# Patient Record
Sex: Male | Born: 1990 | Race: White | Hispanic: No | Marital: Single | Smoking: Never smoker
Health system: Southern US, Community
[De-identification: ages and names within clinical notes are randomized; demographics above are authoritative.]

## PROBLEM LIST (undated history)

## (undated) DIAGNOSIS — I82409 Acute embolism and thrombosis of unspecified deep veins of unspecified lower extremity: Secondary | ICD-10-CM

## (undated) DIAGNOSIS — Z789 Other specified health status: Secondary | ICD-10-CM

## (undated) HISTORY — PX: NO PAST SURGERIES: SHX2092

## (undated) HISTORY — PX: MOUTH SURGERY: SHX715

---

## 2011-08-01 ENCOUNTER — Inpatient Hospital Stay (HOSPITAL_BASED_OUTPATIENT_CLINIC_OR_DEPARTMENT_OTHER)
Admission: EM | Admit: 2011-08-01 | Discharge: 2011-08-04 | DRG: 478 | Disposition: A | Discharge status: Home / Self Care | Payer: BC Managed Care – PPO | Attending: Internal Medicine | Admitting: Internal Medicine

## 2011-08-01 DIAGNOSIS — I82409 Acute embolism and thrombosis of unspecified deep veins of unspecified lower extremity: Secondary | ICD-10-CM

## 2011-08-01 DIAGNOSIS — I82402 Acute embolism and thrombosis of unspecified deep veins of left lower extremity: Secondary | ICD-10-CM

## 2011-08-01 DIAGNOSIS — I824Y9 Acute embolism and thrombosis of unspecified deep veins of unspecified proximal lower extremity: Principal | ICD-10-CM | POA: Diagnosis present

## 2011-08-01 DIAGNOSIS — E871 Hypo-osmolality and hyponatremia: Secondary | ICD-10-CM | POA: Diagnosis not present

## 2011-08-01 HISTORY — DX: Other specified health status: Z78.9

## 2011-08-01 LAB — DIFFERENTIAL
Basophils Relative: 0 % (ref 0–1)
Lymphs Abs: 1.3 10*3/uL (ref 0.7–4.0)
Monocytes Absolute: 0.6 10*3/uL (ref 0.1–1.0)
Monocytes Relative: 8 % (ref 3–12)
Neutro Abs: 5.9 10*3/uL (ref 1.7–7.7)

## 2011-08-01 LAB — COMPREHENSIVE METABOLIC PANEL
Albumin: 4.3 g/dL (ref 3.5–5.2)
Alkaline Phosphatase: 72 U/L (ref 39–117)
BUN: 9 mg/dL (ref 6–23)
CO2: 28 mEq/L (ref 19–32)
Chloride: 95 mEq/L — ABNORMAL LOW (ref 96–112)
Creatinine, Ser: 0.7 mg/dL (ref 0.50–1.35)
GFR calc Af Amer: >90 mL/min (ref >90)
GFR calc non Af Amer: >90 mL/min (ref >90)
Glucose, Bld: 103 mg/dL — ABNORMAL HIGH (ref 70–99)
Total Bilirubin: 0.5 mg/dL (ref 0.3–1.2)

## 2011-08-01 LAB — PROTIME-INR
INR: 1.08 (ref 0.00–1.49)
Prothrombin Time: 14.2 seconds (ref 11.6–15.2)

## 2011-08-01 LAB — CBC
HCT: 43.5 % (ref 39.0–52.0)
Hemoglobin: 14.8 g/dL (ref 13.0–17.0)
MCHC: 34 g/dL (ref 30.0–36.0)
RBC: 5.08 MIL/uL (ref 4.22–5.81)

## 2011-08-01 MED ORDER — ENOXAPARIN SODIUM 60 MG/0.6ML ~~LOC~~ SOLN
60.0000 mg | Freq: Once | SUBCUTANEOUS | Status: DC
  Filled 2011-08-01: qty 0.6

## 2011-08-01 MED ORDER — HYDROMORPHONE HCL PF 1 MG/ML IJ SOLN
1.0000 mg | INTRAMUSCULAR | Status: DC | PRN
  Administered 2011-08-02 – 2011-08-03 (×6): 1 mg via INTRAVENOUS
  Filled 2011-08-01 (×6): qty 1

## 2011-08-01 MED ORDER — SODIUM CHLORIDE 0.9 % IV SOLN: Freq: Once | INTRAVENOUS | Status: DC

## 2011-08-01 MED ORDER — MORPHINE SULFATE 4 MG/ML IJ SOLN
8.0000 mg | Freq: Once | INTRAMUSCULAR | Status: AC
  Administered 2011-08-01: 4 mg via INTRAVENOUS
  Filled 2011-08-01: qty 2

## 2011-08-01 MED ORDER — HEPARIN BOLUS VIA INFUSION
4000.0000 [IU] | Freq: Once | INTRAVENOUS | Status: AC
  Administered 2011-08-01: 4000 [IU] via INTRAVENOUS
  Filled 2011-08-01: qty 4000

## 2011-08-01 MED ORDER — HEPARIN SOD (PORCINE) IN D5W 100 UNIT/ML IV SOLN
1950.0000 [IU]/h | INTRAVENOUS | Status: DC
  Administered 2011-08-01 (×2): 1000 [IU]/h via INTRAVENOUS
  Administered 2011-08-02: 1200 [IU]/h via INTRAVENOUS
  Administered 2011-08-03: 1600 [IU]/h via INTRAVENOUS
  Administered 2011-08-03: 1950 [IU]/h via INTRAVENOUS
  Filled 2011-08-01 (×6): qty 250

## 2011-08-01 MED ORDER — SODIUM CHLORIDE 0.9 % IV SOLN
Freq: Once | INTRAVENOUS | Status: AC
  Administered 2011-08-01: 21:00:00 via INTRAVENOUS

## 2011-08-01 MED ORDER — ONDANSETRON HCL 4 MG/2ML IJ SOLN: 4.0000 mg | Freq: Once | INTRAMUSCULAR | Status: DC

## 2011-08-01 NOTE — ED Provider Notes (Signed)
History     CSN: [id number]  Arrival date & time 08/01/11  1814   First MD Initiated Contact with Patient 08/01/11 1912      Chief Complaint  Patient presents with  . Leg Pain  . Back Pain    (Consider location/radiation/quality/duration/timing/severity/associated sxs/prior treatment) Patient is a 20 y.o. male presenting with leg pain and back pain. The history is provided by the patient. No language interpreter was used.  Leg Pain  The incident occurred more than 1 week ago. There was no injury mechanism. The pain is present in the left knee, left ankle and left thigh. The quality of the pain is described as aching and sharp. The pain is at a severity of 7/10. The pain is moderate. Pertinent negatives include no numbness, no inability to bear weight and no loss of motion. He reports no foreign bodies present. He has tried acetaminophen for the symptoms. The treatment provided no relief.  Back Pain  Associated symptoms include leg pain. Pertinent negatives include no numbness.  Pt reports pain began a week ago in his low back.  Pt reports he was seen by his Md and started on flexeril and vicodin 2 days ago.  Pt reports today leg looks swollen and looks darker than other leg.  Pt complains of pain down his shin.  Pt flew home from college a week ago for winter break.  Flight was about 90 minutes.   History reviewed. No pertinent past medical history.  Past Surgical History  Procedure Date  . Mouth surgery     No family history on file.  History  Substance Use Topics  . Smoking status: Never Smoker   . Smokeless tobacco: Never Used  . Alcohol Use: Yes     occasional      Review of Systems  Musculoskeletal: Positive for back pain and gait problem.  Neurological: Negative for numbness.  All other systems reviewed and are negative.    Allergies  Review of patient's allergies indicates no known allergies.  Home Medications   Current Outpatient Rx  Name Route Sig  Dispense Refill  . CYCLOBENZAPRINE HCL 5 MG PO TABS Oral Take 5 mg by mouth 3 (three) times daily.      Marland Kitchen HYDROCODONE-ACETAMINOPHEN 5-500 MG PO TABS Oral Take 1 tablet by mouth every 4 (four) hours as needed. For pain       BP 106/66  Pulse 122  Temp(Src) 99.2 F (37.3 C) (Oral)  Resp 16  Ht 6' (1.829 m)  Wt 123 lb (55.792 kg)  BMI 16.68 kg/m2  SpO2 100%  Physical Exam  Nursing note and vitals reviewed. Constitutional: He is oriented to person, place, and time. He appears well-developed and well-nourished.  HENT:  Head: Normocephalic and atraumatic.  Mouth/Throat: Oropharynx is clear and moist.  Eyes: Conjunctivae are normal. Pupils are equal, round, and reactive to light.  Cardiovascular: Normal rate and normal heart sounds.   Pulmonary/Chest: Effort normal.  Abdominal: Soft.  Musculoskeletal: He exhibits edema and tenderness.  Neurological: He is alert and oriented to person, place, and time.  Skin: Skin is warm.  Psychiatric: He has a normal mood and affect.    ED Course  Procedures (including critical care time)   Labs Reviewed  CBC  DIFFERENTIAL  COMPREHENSIVE METABOLIC PANEL  PROTIME-INR   No results found.   No diagnosis found.  Results for orders placed during the hospital encounter of 08/01/11  CBC      Component Value Range  WBC 8.0  4.0 - 10.5 (K/uL)   RBC 5.08  4.22 - 5.81 (MIL/uL)   Hemoglobin 14.8  13.0 - 17.0 (g/dL)   HCT 16.1  09.6 - 04.5 (%)   MCV 85.6  78.0 - 100.0 (fL)   MCH 29.1  26.0 - 34.0 (pg)   MCHC 34.0  30.0 - 36.0 (g/dL)   RDW 40.9  81.1 - 91.4 (%)   Platelets 309  150 - 400 (K/uL)  DIFFERENTIAL      Component Value Range   Neutrophils Relative 74  43 - 77 (%)   Neutro Abs 5.9  1.7 - 7.7 (K/uL)   Lymphocytes Relative 17  12 - 46 (%)   Lymphs Abs 1.3  0.7 - 4.0 (K/uL)   Monocytes Relative 8  3 - 12 (%)   Monocytes Absolute 0.6  0.1 - 1.0 (K/uL)   Eosinophils Relative 2  0 - 5 (%)   Eosinophils Absolute 0.1  0.0 - 0.7 (K/uL)    Basophils Relative 0  0 - 1 (%)   Basophils Absolute 0.0  0.0 - 0.1 (K/uL)  COMPREHENSIVE METABOLIC PANEL      Component Value Range   Sodium 135  135 - 145 (mEq/L)   Potassium 3.7  3.5 - 5.1 (mEq/L)   Chloride 95 (*) 96 - 112 (mEq/L)   CO2 28  19 - 32 (mEq/L)   Glucose, Bld 103 (*) 70 - 99 (mg/dL)   BUN 9  6 - 23 (mg/dL)   Creatinine, Ser 7.82  0.50 - 1.35 (mg/dL)   Calcium 9.8  8.4 - 95.6 (mg/dL)   Total Protein 7.8  6.0 - 8.3 (g/dL)   Albumin 4.3  3.5 - 5.2 (g/dL)   AST 22  0 - 37 (U/L)   ALT 17  0 - 53 (U/L)   Alkaline Phosphatase 72  39 - 117 (U/L)   Total Bilirubin 0.5  0.3 - 1.2 (mg/dL)   GFR calc non Af Amer >90  >90 (mL/min)   GFR calc Af Amer >90  >90 (mL/min)  PROTIME-INR      Component Value Range   Prothrombin Time 14.2  11.6 - 15.2 (seconds)   INR 1.08  0.00 - 1.49    No results found.   MDM  Dr. Patria Mane in to see and examine pt.   Ultrasound by Dr. Patria Mane shows DVT.   Pt given Lovenox injection and labs obtained.  Dr. Patria Mane spoke to Dr. Edilia [facility] who advised consult interventional radiology,  Elevation and heparin,  Dr. Patria Mane spoke to Dr. Miles Costain who advised he will see pt in am.  Labs returned.  I spoke to Dr. Lovell Sheehan  [facility] who will admit.          Langston Masker, [place] 08/01/11 2146

## 2011-08-01 NOTE — ED Notes (Signed)
Calls placed to vascular and [facility] via [facility]

## 2011-08-01 NOTE — ED Notes (Signed)
Pt reports onset of back pain radiating to left hip and leg that started one week ago.  He was seen by PMD, had a left hip XR that was normal and blood work done.  He reports pain isn't better after taking Flexeril and Vicodin.

## 2011-08-02 ENCOUNTER — Encounter (HOSPITAL_COMMUNITY): Payer: Self-pay | Admitting: *Deleted

## 2011-08-02 DIAGNOSIS — M7989 Other specified soft tissue disorders: Secondary | ICD-10-CM

## 2011-08-02 DIAGNOSIS — I82402 Acute embolism and thrombosis of unspecified deep veins of left lower extremity: Secondary | ICD-10-CM

## 2011-08-02 LAB — BASIC METABOLIC PANEL
GFR calc Af Amer: >90 mL/min (ref >90)
GFR calc non Af Amer: >90 mL/min (ref >90)
Potassium: 4.1 mEq/L (ref 3.5–5.1)
Sodium: 131 mEq/L — ABNORMAL LOW (ref 135–145)

## 2011-08-02 LAB — MAGNESIUM: Magnesium: 1.8 mg/dL (ref 1.5–2.5)

## 2011-08-02 LAB — TSH: TSH: 2.946 u[IU]/mL (ref 0.350–4.500)

## 2011-08-02 LAB — ANTITHROMBIN III: AntiThromb III Func: 75 % (ref 75–120)

## 2011-08-02 LAB — PHOSPHORUS: Phosphorus: 3.2 mg/dL (ref 2.3–4.6)

## 2011-08-02 MED ORDER — SODIUM CHLORIDE 0.9 % IV SOLN
INTRAVENOUS | Status: DC
  Administered 2011-08-02 – 2011-08-03 (×3): via INTRAVENOUS
  Administered 2011-08-04: 100 mL/h via INTRAVENOUS

## 2011-08-02 MED ORDER — PROMETHAZINE HCL 25 MG/ML IJ SOLN: 12.5000 mg | Freq: Four times a day (QID) | INTRAMUSCULAR | Status: DC | PRN

## 2011-08-02 MED ORDER — SODIUM CHLORIDE 0.9 % IJ SOLN
3.0000 mL | Freq: Two times a day (BID) | INTRAMUSCULAR | Status: DC
  Administered 2011-08-02: 3 mL via INTRAVENOUS

## 2011-08-02 MED ORDER — HEPARIN BOLUS VIA INFUSION: 4000.0000 [IU] | Freq: Once | INTRAVENOUS | Status: DC

## 2011-08-02 MED ORDER — HEPARIN BOLUS VIA INFUSION
4000.0000 [IU] | Freq: Once | INTRAVENOUS | Status: AC
  Administered 2011-08-02: 4000 [IU] via INTRAVENOUS
  Filled 2011-08-02: qty 4000

## 2011-08-02 MED ORDER — SODIUM CHLORIDE 0.9 % IV SOLN
250.0000 mL | INTRAVENOUS | Status: DC | PRN
  Administered 2011-08-02: 250 mL via INTRAVENOUS

## 2011-08-02 MED ORDER — CYCLOBENZAPRINE HCL 5 MG PO TABS
5.0000 mg | ORAL_TABLET | Freq: Three times a day (TID) | ORAL | Status: DC
  Administered 2011-08-02 (×3): 5 mg via ORAL
  Filled 2011-08-02 (×6): qty 1

## 2011-08-02 MED ORDER — HEPARIN BOLUS VIA INFUSION
2000.0000 [IU] | Freq: Once | INTRAVENOUS | Status: AC
  Administered 2011-08-02: 2000 [IU] via INTRAVENOUS
  Filled 2011-08-02: qty 2000

## 2011-08-02 MED ORDER — SODIUM CHLORIDE 0.9 % IJ SOLN
3.0000 mL | INTRAMUSCULAR | Status: DC | PRN
  Administered 2011-08-03: 3 mL via INTRAVENOUS

## 2011-08-02 MED ORDER — PROMETHAZINE HCL 25 MG PO TABS: 12.5000 mg | ORAL_TABLET | Freq: Four times a day (QID) | ORAL | Status: DC | PRN

## 2011-08-02 MED ORDER — HYDROCODONE-ACETAMINOPHEN 5-325 MG PO TABS
2.0000 | ORAL_TABLET | ORAL | Status: DC | PRN
  Administered 2011-08-02 – 2011-08-04 (×3): 2 via ORAL
  Filled 2011-08-02 (×4): qty 2

## 2011-08-02 MED ORDER — OXYCODONE HCL 5 MG PO TABS: 5.0000 mg | ORAL_TABLET | ORAL | Status: DC | PRN

## 2011-08-02 NOTE — H&P (Signed)
PCP:  No primary provider on file.   DOA:  08/01/2011  6:35 PM  Chief Complaint:  Left leg swelling and tibial pain  HPI: Patient is a 20 y.o. male who presens with left LE pain and swelling that he first noticed approximately one week prior to admission and has been getting progressively worse. He describes pain as sharp, non radiating, with no specific aggravating or alleviating factors, 7/10 in severity, no similar episodes in the past. He denies any trauma to the area. Currently he has difficulty ambulating due to pain. He denies fevers, chills, no other systemic concerns, no abdominal or urinary concerns. He denies recent sicknesses or hospitalizations, no LE weakness or numbness, no tingling, no warmth to touch.  Allergies: No Known Allergies  Prior to Admission medications   Medication Sig Start Date End Date Taking? Authorizing Provider  cyclobenzaprine (FLEXERIL) 5 MG tablet Take 5 mg by mouth 3 (three) times daily.     Yes Historical Provider, MD  HYDROcodone-acetaminophen (VICODIN) 5-500 MG per tablet Take 1 tablet by mouth every 4 (four) hours as needed. For pain    Yes Historical Provider, MD    Past Medical History  Diagnosis Date  . No pertinent past medical history     Past Surgical History  Procedure Date  . Mouth surgery   . No past surgeries     Social History:  reports that he has never smoked. He has never used smokeless tobacco. He reports that he drinks alcohol. He reports that he does not use illicit drugs.  No family history on file.  Review of Systems:  Constitutional: Denies fever, chills, diaphoresis, appetite change and fatigue.  HEENT: Denies photophobia, eye pain, redness, hearing loss, ear pain, congestion, sore throat, rhinorrhea, sneezing, mouth sores, trouble swallowing, neck pain, neck stiffness and tinnitus.   Respiratory: Denies SOB, DOE, cough, chest tightness,  and wheezing.   Cardiovascular: Denies chest pain, palpitations and leg  swelling.  Gastrointestinal: Denies nausea, vomiting, abdominal pain, diarrhea, constipation, blood in stool and abdominal distention.  Genitourinary: Denies dysuria, urgency, frequency, hematuria, flank pain and difficulty urinating.  Musculoskeletal: Denies myalgias, joint swelling, arthralgias.  Skin: Denies pallor, rash and wound.  Neurological: Denies dizziness, seizures, syncope, weakness, light-headedness, numbness and headaches.  Hematological: Denies adenopathy. Easy bruising, personal or family bleeding history  Psychiatric/Behavioral: Denies suicidal ideation, mood changes, confusion, nervousness, sleep disturbance and agitation  Physical Exam:  Filed Vitals:   08/01/11 1831 08/01/11 1851 08/01/11 2236 08/02/11 0005  BP: 106/66  120/65 115/75  Pulse: 122  104 101  Temp: 99.2 F (37.3 C)  99.9 F (37.7 C) 99.5 F (37.5 C)  TempSrc: Oral  Oral Oral  Resp: 16   18  Height: 6' (1.829 m) 6' (1.829 m)    Weight: 55.792 kg (123 lb) 55.792 kg (123 lb)    SpO2: 100%  100%     Constitutional: Vital signs reviewed.  Patient is a well-developed and well-nourished in no acute distress and cooperative with exam. Alert and oriented x3.  Head: Normocephalic and atraumatic Ear: TM normal bilaterally Mouth: no erythema or exudates, MMM Eyes: PERRL, EOMI, conjunctivae normal, No scleral icterus.  Neck: Supple, Trachea midline normal ROM, No JVD, mass, thyromegaly, or carotid bruit present.  Cardiovascular: RRR, S1 normal, S2 normal, no MRG, pulses symmetric and intact bilaterally Pulmonary/Chest: CTAB, no wheezes, rales, or rhonchi Abdominal: Soft. Non-tender, non-distended, bowel sounds are normal, no masses, organomegaly, or guarding present.  GU: no CVA tenderness Musculoskeletal: No  joint deformities, erythema, or stiffness, ROM full and no nontender Ext: no edema in right LE and no cyanosis, Left LE slightly more swollen, pulses palpable bilaterally (DP and PT) Hematology: no  cervical, inginal, or axillary adenopathy.  Neurological: A&O x3, Strenght is normal and symmetric bilaterally, cranial nerve II-XII are grossly intact, no focal motor deficit, sensory intact to light touch bilaterally.  Skin: Warm, dry and intact. No rash, cyanosis, or clubbing.  Psychiatric: Normal mood and affect. speech and behavior is normal. Judgment and thought content normal. Cognition and memory are normal.   Labs on Admission:  Results for orders placed during the hospital encounter of 08/01/11 (from the past 48 hour(s))  CBC     Status: Normal   Collection Time   08/01/11  8:22 PM      Component Value Range Comment   WBC 8.0  4.0 - 10.5 (K/uL)    RBC 5.08  4.22 - 5.81 (MIL/uL)    Hemoglobin 14.8  13.0 - 17.0 (g/dL)    HCT 45.4  09.8 - 11.9 (%)    MCV 85.6  78.0 - 100.0 (fL)    MCH 29.1  26.0 - 34.0 (pg)    MCHC 34.0  30.0 - 36.0 (g/dL)    RDW 14.7  82.9 - 56.2 (%)    Platelets 309  150 - 400 (K/uL)   DIFFERENTIAL     Status: Normal   Collection Time   08/01/11  8:22 PM      Component Value Range Comment   Neutrophils Relative 74  43 - 77 (%)    Neutro Abs 5.9  1.7 - 7.7 (K/uL)    Lymphocytes Relative 17  12 - 46 (%)    Lymphs Abs 1.3  0.7 - 4.0 (K/uL)    Monocytes Relative 8  3 - 12 (%)    Monocytes Absolute 0.6  0.1 - 1.0 (K/uL)    Eosinophils Relative 2  0 - 5 (%)    Eosinophils Absolute 0.1  0.0 - 0.7 (K/uL)    Basophils Relative 0  0 - 1 (%)    Basophils Absolute 0.0  0.0 - 0.1 (K/uL)   COMPREHENSIVE METABOLIC PANEL     Status: Abnormal   Collection Time   08/01/11  8:22 PM      Component Value Range Comment   Sodium 135  135 - 145 (mEq/L)    Potassium 3.7  3.5 - 5.1 (mEq/L)    Chloride 95 (*) 96 - 112 (mEq/L)    CO2 28  19 - 32 (mEq/L)    Glucose, Bld 103 (*) 70 - 99 (mg/dL)    BUN 9  6 - 23 (mg/dL)    Creatinine, Ser 1.30  0.50 - 1.35 (mg/dL)    Calcium 9.8  8.4 - 10.5 (mg/dL)    Total Protein 7.8  6.0 - 8.3 (g/dL)    Albumin 4.3  3.5 - 5.2 (g/dL)     AST 22  0 - 37 (U/L)    ALT 17  0 - 53 (U/L)    Alkaline Phosphatase 72  39 - 117 (U/L)    Total Bilirubin 0.5  0.3 - 1.2 (mg/dL)    GFR calc non Af Amer >90  >90 (mL/min)    GFR calc Af Amer >90  >90 (mL/min)   PROTIME-INR     Status: Normal   Collection Time   08/01/11  8:32 PM      Component Value Range Comment   Prothrombin Time  14.2  11.6 - 15.2 (seconds)    INR 1.08  0.00 - 1.49      Radiological Exams on Admission: No information on file.  Assessment/Plan  Principal Problem:  *Left leg DVT - obtain LE doppler for confirmation - continue treatment with Heparin and plan transition to Coumadin - pt is currently hemodynamically stable - monitor vitals per floor protocol   Disposition - plan of care and diagnosis, diagnostic studies and test results were discussed with pt and family at bedside - pt and  family verbalized understanding  Time Spent on Admission:  Over 30 minutes  MAGICK-Karas Pickerill 08/02/2011, 1:18 AM  [facility] Pager [phone]

## 2011-08-02 NOTE — Progress Notes (Signed)
ANTICOAGULATION CONSULT NOTE - Follow Up Consult  Pharmacy Consult for Heparin Indication: DVT  No Known Allergies  Patient Measurements: Height: 6' (182.9 cm) Weight: 123 lb (55.792 kg) IBW/kg (Calculated) : 77.6   Vital Signs: Temp: 99.6 F (37.6 C) (12/22 1343) Temp src: Oral (12/22 1343) BP: 116/75 mmHg (12/22 1343) Pulse Rate: 87  (12/22 1343)  Labs:  Basename 08/02/11 1502 08/02/11 0916 08/02/11 0145 08/01/11 2032 08/01/11 2022  HGB -- -- -- -- 14.8  HCT -- -- -- -- 43.5  PLT -- -- -- -- 309  APTT -- -- -- -- --  LABPROT -- -- -- 14.2 --  INR -- -- -- 1.08 --  HEPARINUNFRC 0.22* 0.47 0.25* -- --  CREATININE -- -- 0.66 -- 0.70  CKTOTAL -- -- -- -- --  CKMB -- -- -- -- --  TROPONINI -- -- -- -- --   Estimated Creatinine Clearance: 116.3 ml/min (by C-G formula based on Cr of 0.66).   Medications:  Scheduled:    . sodium chloride   Intravenous Once  . cyclobenzaprine  5 mg Oral TID  . heparin  4,000 Units Intravenous Once  . heparin  4,000 Units Intravenous Once  .  morphine injection  8 mg Intravenous Once  . sodium chloride  3 mL Intravenous Q12H  . DISCONTD: sodium chloride   Intravenous Once  . DISCONTD: enoxaparin  60 mg Subcutaneous Once  . DISCONTD: heparin  4,000 Units Intravenous Once  . DISCONTD: ondansetron  4 mg Intravenous Once    Assessment: 20 yo M with new DVT started on IV heparin.  Repeat level subtherapeutic. No bleeding noted. Infusing without complications.  Noted plan for lysis on 12/23.    Goal of Therapy:  Heparin level 0.3-0.7 units/ml   Plan:  1) Rebolus heparin 2000 units x 1 2) Increase heparin 1300 units/hr 3) Check 6 hr heparin level 4) F/U anticoag/lytic orders on 12/23  Elson Clan 08/02/2011,4:47 PM

## 2011-08-02 NOTE — Progress Notes (Signed)
Pt arrived on floor. Dr paged. Patient's Left foot was dopplered and a weak pulse was heard.

## 2011-08-02 NOTE — Progress Notes (Signed)
Family concerned that no Dr. Has been in the room to explain procedure or possible option. Pt has been premedicated well with Dilaudid for pain and Flexiril for muscle spasms. Seem to help relax pt. Dr. Rica Records paged while in IR. Author spoke with Shanon Ace since MD was doing a procedure. Paged again after 2hrs as the [place] was not read yet. Author spoke with Dr. Lenetta Quaker who gave me Dr. Serena Colonel direct pager. Dr. Rica Records called back with a plan to try to lyse the clot in the morning and continue with hep therapy and keep pt NPO after midnight. Family still not content, paged MD again and he will be here tomorrow at 0700 with explanations and answer any family questions. Will continue to monitor.

## 2011-08-02 NOTE — Progress Notes (Signed)
Patient and his mother were given deep vein thrombosis education material from [facility] consult. Will continue to monitor.

## 2011-08-02 NOTE — Progress Notes (Signed)
ANTICOAGULATION CONSULT NOTE - Initial Consult  Pharmacy Consult for heparin Indication: DVT  No Known Allergies  Patient Measurements: Height: 6' (182.9 cm) Weight: 123 lb (55.792 kg) IBW/kg (Calculated) : 77.6   Vital Signs: Temp: 98.3 F (36.8 C) (12/22 0601) Temp src: Oral (12/22 0601) BP: 117/68 mmHg (12/22 0601) Pulse Rate: 100  (12/22 0601)  Labs:  Basename 08/02/11 0916 08/02/11 0145 08/01/11 2032 08/01/11 2022  HGB -- -- -- 14.8  HCT -- -- -- 43.5  PLT -- -- -- 309  APTT -- -- -- --  LABPROT -- -- 14.2 --  INR -- -- 1.08 --  HEPARINUNFRC 0.47 0.25* -- --  CREATININE -- 0.66 -- 0.70  CKTOTAL -- -- -- --  CKMB -- -- -- --  TROPONINI -- -- -- --   Estimated Creatinine Clearance: 116.3 ml/min (by C-G formula based on Cr of 0.66).  Medical History: Past Medical History  Diagnosis Date  . No pertinent past medical history     Medications:  Prescriptions prior to admission  Medication Sig Dispense Refill  . cyclobenzaprine (FLEXERIL) 5 MG tablet Take 5 mg by mouth 3 (three) times daily.        Marland Kitchen HYDROcodone-acetaminophen (VICODIN) 5-500 MG per tablet Take 1 tablet by mouth every 4 (four) hours as needed. For pain        Scheduled:     . sodium chloride   Intravenous Once  . cyclobenzaprine  5 mg Oral TID  . heparin  4,000 Units Intravenous Once  . heparin  4,000 Units Intravenous Once  .  morphine injection  8 mg Intravenous Once  . sodium chloride  3 mL Intravenous Q12H  . DISCONTD: sodium chloride   Intravenous Once  . DISCONTD: enoxaparin  60 mg Subcutaneous Once  . DISCONTD: heparin  4,000 Units Intravenous Once  . DISCONTD: ondansetron  4 mg Intravenous Once    Assessment: 20yo male subtherapeutic on heparin started at [facility] for +DVT. Noted LMWH ordered at [facility], but not charted as given, but confirmed that patient received 2 x heparin 4000 unit bolus (at [facility] last  PM and then again this AM). Heparin level therapeutic for goal. CBC ok.  Goal of  Therapy:  Heparin level 0.3-0.7 units/ml   Plan: 1. Continue heparin drip at 1200 units/hr. 2. Recheck heparin level at 1500 to ensure therapeutic per protocol.  Sherrice Creekmore K. Allena Katz, PharmD, BCPS.  Clinical Pharmacist Pager [phone]. 08/02/2011 10:34 AM

## 2011-08-02 NOTE — ED Provider Notes (Addendum)
Medical screening examination/treatment/procedure(s) were conducted as a shared visit with non-physician practitioner(s) and myself.  I personally evaluated the patient during the encounter  Personally evaluated patient's left leg.  He has dopplerable femoral popliteal and PT pulses on his left.  His left leg does appear swollen and is slightly dusky on examination.  There is no erythema.  His main complaint is left tibial pain.  On that side ultrasound in the emergency department I personally evaluated the patient's left femoral vein and there appears to be occlusive clot as it is noncompressible from the inguinal ligament down to the level of his distal thigh.  Given his symptoms which are concerning for DVT in my bedside ultrasound findings which demonstrate a noncompressible left femoral vein with what appears to be clotted at the patient will be started on anticoagulation.  Had concerns for phlegmasia and thus I discussed the case with the vascular surgeon who recommends elevation and heparinization at this time.  He also recommends consultation with interventional radiology for possible lysis and further evaluation.  I discussed this with interventional radiology who will see the patient first thing in the morning.  The patient's been heparinized in the emergency apartment will be transferred to [facility] in stable condition.  He will require formal ultrasonography/venous duplex imaging of his left lower extremity for confirmation.  His compartments are soft he has no chest pain or shortness of breath to suggest pulmonary embolism.  1. Left leg DVT     Lyanne Co, MD 08/02/11 2956  Lyanne Co, MD 08/02/11 [phone]

## 2011-08-02 NOTE — Progress Notes (Signed)
*  PRELIMINARY RESULTS*   Left lower extremity venous Doppler completed.  There is acute, occlusive deep vein thrombosis noted throughout the left lower extremity, beginning in the posterior tibial and coursing through to the popliteal, femoral and common femoral veins.  No propagation to the right lower extremity noted.    Sherren Kerns Renee 08/02/2011, 11:44 AM

## 2011-08-02 NOTE — Progress Notes (Addendum)
  Subjective: Denies leg pain at this time.  Objective: Vital signs in last 24 hours: Temp:  [98.3 F (36.8 C)-99.9 F (37.7 C)] 98.3 F (36.8 C) (12/22 0601) Pulse Rate:  [100-122] 100  (12/22 0601) Resp:  [16-18] 18  (12/22 0601) BP: (106-120)/(65-75) 117/68 mmHg (12/22 0601) SpO2:  [100 %] 100 % (12/22 0601) Weight:  [123 lb (55.792 kg)] 123 lb (55.792 kg) (12/21 1851) Last BM Date: 08/01/11  Intake/Output from previous day:   Intake/Output this shift:    Exam: Left leg edematous compared with right leg on visual inspection. Pt denies pain with palpation of calf. Negative Homan's sign.  Lab Results:   [facility] 08/01/11 2022  WBC 8.0  HGB 14.8  HCT 43.5  PLT 309   BMET  Basename 08/02/11 0145 08/01/11 2022  NA 131* 135  K 4.1 3.7  CL 97 95*  CO2 25 28  GLUCOSE 117* 103*  BUN 8 9  CREATININE 0.66 0.70  CALCIUM 9.3 9.8   PT/INR  Basename 08/01/11 2032  LABPROT 14.2  INR 1.08   ABG No results found for this basename: PHART:2,PCO2:2,PO2:2,HCO3:2 in the last 72 hours  Studies/Results: No results found.  Anti-infectives: Anti-infectives    None      Assessment/Plan Left lower extremity DVT. Recent air travel. Awaiting lower extremity dopplers for further evaluation of the extent of the problem. Pt currently on IV heparin. Discussed lysis procedure with pt and his mother. No obvious contraindications. Discussed with Dr Deanne Coffer. Await official doppler report before making any decisions as to Lytic therapy.  LOS: 1 day    RINEHULS,DAVID 08/02/2011   US shows extensive LLE DVT through common femoral vein. Mechanical/pharmacologic lysis indicated to preserve venous valvular competence, avoid chronic venous insufficiency, exclude May-Thurner syndrome. Will make pt NPO p MN, plan lysis in AM.

## 2011-08-02 NOTE — Progress Notes (Signed)
Subjective: Pain better. Denies chest pain or dyspnea.  Objective: Filed Vitals:   08/01/11 2236 08/02/11 0005 08/02/11 0601 08/02/11 1343  BP: 120/65 115/75 117/68 116/75  Pulse: 104 101 100 87  Temp: 99.9 F (37.7 C) 99.5 F (37.5 C) 98.3 F (36.8 C) 99.6 F (37.6 C)  TempSrc: Oral Oral Oral Oral  Resp:  18 18 16   Height:      Weight:      SpO2: 100%  100% 96%    General: Alert, awake, oriented x3, in no acute distress.  HEENT: No bruits, no goiter.  Heart: Regular rate and rhythm, without murmurs, rubs, gallops.  Lungs: CTA, bilateral air movement.  Abdomen: Soft, nontender, nondistended, positive bowel sounds.  Neuro: Grossly intact, nonfocal. Extremities: Left lower extremities with mild redness, swelling.   Lab Results:  [facility] 08/02/11 0145 08/01/11 2022  NA 131* 135  K 4.1 3.7  CL 97 95*  CO2 25 28  GLUCOSE 117* 103*  BUN 8 9  CREATININE 0.66 0.70  CALCIUM 9.3 9.8  MG 1.8 --  PHOS 3.2 --    Basename 08/01/11 2022  AST 22  ALT 17  ALKPHOS 72  BILITOT 0.5  PROT 7.8  ALBUMIN 4.3   Basename 08/01/11 2022  WBC 8.0  NEUTROABS 5.9  HGB 14.8  HCT 43.5  MCV 85.6  PLT 309    Basename 08/02/11 0145  TSH 2.946  T4TOTAL --  T3FREE --  THYROIDAB --     Medications: I have reviewed the patient's current medications.   Patient Active Hospital Problem List: Left leg DVT (08/02/2011) He denies chest pain or dyspnea.  unprovoked DVT, I will check Hypercoagulable panel although patient on heparin. Some of the test will need to be repeated outpatient. Patient will need referral to hematology outpatient. Continue with heparin. Will defer start coumadin to IR. For possible lysis tomorrow.  CBC in am.  Mild Hyponatremia: IV fluids.   LOS: 1 day   Daley Mooradian M.D.  [facility] 08/02/2011, 5:52 PM

## 2011-08-02 NOTE — Progress Notes (Signed)
ANTICOAGULATION CONSULT NOTE - Initial Consult  Pharmacy Consult for heparin Indication: DVT  No Known Allergies  Patient Measurements: Height: 6' (182.9 cm) Weight: 123 lb (55.792 kg) IBW/kg (Calculated) : 77.6   Vital Signs: Temp: 99.5 F (37.5 C) (12/22 0005) Temp src: Oral (12/22 0005) BP: 115/75 mmHg (12/22 0005) Pulse Rate: 101  (12/22 0005)  Labs:  Basename 08/02/11 0145 08/01/11 2032 08/01/11 2022  HGB -- -- 14.8  HCT -- -- 43.5  PLT -- -- 309  APTT -- -- --  LABPROT -- 14.2 --  INR -- 1.08 --  HEPARINUNFRC 0.25* -- --  CREATININE 0.66 -- 0.70  CKTOTAL -- -- --  CKMB -- -- --  TROPONINI -- -- --   Estimated Creatinine Clearance: 116.3 ml/min (by C-G formula based on Cr of 0.66).  Medical History: Past Medical History  Diagnosis Date  . No pertinent past medical history     Medications:  Prescriptions prior to admission  Medication Sig Dispense Refill  . cyclobenzaprine (FLEXERIL) 5 MG tablet Take 5 mg by mouth 3 (three) times daily.        Marland Kitchen HYDROcodone-acetaminophen (VICODIN) 5-500 MG per tablet Take 1 tablet by mouth every 4 (four) hours as needed. For pain        Scheduled:    . sodium chloride   Intravenous Once  . cyclobenzaprine  5 mg Oral TID  . heparin  4,000 Units Intravenous Once  . heparin  4,000 Units Intravenous Once  .  morphine injection  8 mg Intravenous Once  . sodium chloride  3 mL Intravenous Q12H  . DISCONTD: sodium chloride   Intravenous Once  . DISCONTD: enoxaparin  60 mg Subcutaneous Once  . DISCONTD: heparin  4,000 Units Intravenous Once  . DISCONTD: ondansetron  4 mg Intravenous Once    Assessment: 20yo male subtherapeutic on heparin started at [facility] for +DVT; pt rec'd 4000 unit bolus after being given Lovenox 1mg /kg, then got repeat 4000 unit bolus after arrival (unclear whether before or after heparin level drawn); would expect level to be higher with these duplicate heparin products.  Goal of Therapy:  Heparin level  0.3-0.7 units/ml   Plan:  Will increase heparin to 1200 units/hr and check level in 6hr.  Colleen Can PharmD BCPS 08/02/2011,3:31 AM

## 2011-08-03 ENCOUNTER — Inpatient Hospital Stay (HOSPITAL_COMMUNITY): Payer: BC Managed Care – PPO

## 2011-08-03 DIAGNOSIS — I80299 Phlebitis and thrombophlebitis of other deep vessels of unspecified lower extremity: Secondary | ICD-10-CM

## 2011-08-03 LAB — CBC
Platelets: 301 10*3/uL (ref 150–400)
RDW: 12.3 % (ref 11.5–15.5)
WBC: 7.1 10*3/uL (ref 4.0–10.5)

## 2011-08-03 LAB — BASIC METABOLIC PANEL
Chloride: 98 mEq/L (ref 96–112)
Creatinine, Ser: 0.68 mg/dL (ref 0.50–1.35)
GFR calc Af Amer: >90 mL/min (ref >90)
Potassium: 4.2 mEq/L (ref 3.5–5.1)

## 2011-08-03 LAB — RAPID URINE DRUG SCREEN, HOSP PERFORMED
Benzodiazepines: NOT DETECTED
Cocaine: NOT DETECTED

## 2011-08-03 LAB — HEPARIN LEVEL (UNFRACTIONATED): Heparin Unfractionated: 0.27 IU/mL — ABNORMAL LOW (ref 0.30–0.70)

## 2011-08-03 MED ORDER — FENTANYL CITRATE 0.05 MG/ML IJ SOLN
INTRAMUSCULAR | Status: AC | PRN
  Administered 2011-08-03: 100 ug via INTRAVENOUS

## 2011-08-03 MED ORDER — SODIUM CHLORIDE 0.9 % IV SOLN
5.0000 mg | Freq: Once | INTRAVENOUS | Status: DC
  Filled 2011-08-03: qty 5

## 2011-08-03 MED ORDER — FENTANYL CITRATE 0.05 MG/ML IJ SOLN
INTRAMUSCULAR | Status: AC
  Filled 2011-08-03: qty 4

## 2011-08-03 MED ORDER — HEPARIN BOLUS VIA INFUSION
2000.0000 [IU] | Freq: Once | INTRAVENOUS | Status: AC
  Administered 2011-08-03: 2000 [IU] via INTRAVENOUS
  Filled 2011-08-03: qty 2000

## 2011-08-03 MED ORDER — ALTEPLASE 100 MG IV SOLR
5.0000 mg | Freq: Once | INTRAVENOUS | Status: AC
  Administered 2011-08-03: 5 mg
  Filled 2011-08-03: qty 5

## 2011-08-03 MED ORDER — MIDAZOLAM HCL 5 MG/5ML IJ SOLN
INTRAMUSCULAR | Status: AC | PRN
  Administered 2011-08-03 (×2): 2 mg via INTRAVENOUS

## 2011-08-03 MED ORDER — FENTANYL CITRATE 0.05 MG/ML IJ SOLN
INTRAMUSCULAR | Status: DC | PRN
  Administered 2011-08-03 (×4): 50 ug via INTRAVENOUS

## 2011-08-03 MED ORDER — MIDAZOLAM HCL 2 MG/2ML IJ SOLN
INTRAMUSCULAR | Status: AC
  Filled 2011-08-03: qty 4

## 2011-08-03 MED ORDER — MIDAZOLAM HCL 5 MG/5ML IJ SOLN
INTRAMUSCULAR | Status: DC | PRN
  Administered 2011-08-03 (×4): 1 mg via INTRAVENOUS

## 2011-08-03 MED ORDER — IOHEXOL 300 MG/ML  SOLN
150.0000 mL | Freq: Once | INTRAMUSCULAR | Status: AC | PRN
  Administered 2011-08-03: 90 mL via INTRAVENOUS

## 2011-08-03 MED ORDER — CYCLOBENZAPRINE HCL 10 MG PO TABS: 5.0000 mg | ORAL_TABLET | Freq: Three times a day (TID) | ORAL | Status: DC | PRN

## 2011-08-03 NOTE — Procedures (Signed)
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 Good response to mechanical and pharmacologic lysis. 14x60 Zilver stent across L CIV stenosis. No complication No blood loss. See complete dictation in [facility].

## 2011-08-03 NOTE — ED Notes (Signed)
Pt arrived from in patient unit with grayish blue nailbeds( RA sats 100%)

## 2011-08-03 NOTE — Progress Notes (Signed)
Subjective: He denies chest pain or dyspnea. No worsening lower extremity pain. He relates change in color of left lower extremity on ambulation.  Objective: Filed Vitals:   08/02/11 0601 08/02/11 1343 08/02/11 2051 08/03/11 0425  BP: 117/68 116/75 116/70 118/72  Pulse: 100 87 100 99  Temp: 98.3 F (36.8 C) 99.6 F (37.6 C) 100 F (37.8 C) 98.4 F (36.9 C)  TempSrc: Oral Oral Oral Oral  Resp: 18 16 18 18   Height:      Weight:      SpO2: 100% 96% 95% 100%    General: Alert, awake, oriented x3, in no acute distress.  HEENT: No bruits, no goiter.  Heart: Regular rate and rhythm, without murmurs, rubs, gallops.  Lungs: CTA, bilateral air movement.  Abdomen: Soft, nontender, nondistended, positive bowel sounds.  Neuro: Grossly intact, nonfocal. Extremities: left lower extremities with swelling, no cyanosis.   Lab Results:  [facility] 08/02/11 0145 08/01/11 2022  NA 131* 135  K 4.1 3.7  CL 97 95*  CO2 25 28  GLUCOSE 117* 103*  BUN 8 9  CREATININE 0.66 0.70  CALCIUM 9.3 9.8  MG 1.8 --  PHOS 3.2 --    Basename 08/01/11 2022  AST 22  ALT 17  ALKPHOS 72  BILITOT 0.5  PROT 7.8  ALBUMIN 4.3    Basename 08/03/11 0615 08/01/11 2022  WBC 7.1 8.0  NEUTROABS -- 5.9  HGB 13.0 14.8  HCT 38.6* 43.5  MCV 87.1 85.6  PLT 301 309    Basename 08/02/11 0145  TSH 2.946  T4TOTAL --  T3FREE --  THYROIDAB --   Micro Results: No results found for this or any previous visit (from the past 240 hour(s)).  Studies/Results: No results found.  Medications: I have reviewed the patient's current medications.  Patient Active Hospital Problem List:  Left leg DVT (08/02/2011)  Unprovoked DVT,  Hypercoagulable panel pending. Some of the test will need to be repeated outpatient. Patient will need referral to hematology outpatient.  Continue with heparin. Will defer start coumadin to IR.  For possible thrombolysis today. Appreciated Dr Edilia Bo help.  CBC in am.  Change flexeril to  PRN. Leg elevation. Mild Hyponatremia: IV fluids.       LOS: 2 days   Breckan Cafiero M.D.  [facility] 08/03/2011, 8:00 AM

## 2011-08-03 NOTE — Progress Notes (Signed)
ANTICOAGULATION CONSULT NOTE - Follow Up Consult  Pharmacy Consult for Heparin Indication: DVT  No Known Allergies  Patient Measurements: Height: 6' (182.9 cm) Weight: 123 lb (55.792 kg) IBW/kg (Calculated) : 77.6  Heparin weight: 56Kg  Vital Signs: Temp: 98.4 F (36.9 C) (12/23 0425) Temp src: Oral (12/23 0425) BP: 118/72 mmHg (12/23 0425) Pulse Rate: 99  (12/23 0425)  Labs:  Basename 08/03/11 0615 08/02/11 2310 08/02/11 1502 08/02/11 0145 08/01/11 2032 08/01/11 2022  HGB 13.0 -- -- -- -- 14.8  HCT 38.6* -- -- -- -- 43.5  PLT 301 -- -- -- -- 309  APTT -- -- -- -- -- --  LABPROT -- -- -- -- 14.2 --  INR -- -- -- -- 1.08 --  HEPARINUNFRC 0.27* <0.10* 0.22* -- -- --  CREATININE 0.68 -- -- 0.66 -- 0.70  CKTOTAL -- -- -- -- -- --  CKMB -- -- -- -- -- --  TROPONINI -- -- -- -- -- --   Estimated Creatinine Clearance: 116.3 ml/min (by C-G formula based on Cr of 0.68).   Medications:  Scheduled:     . heparin  2,000 Units Intravenous Once  . heparin  2,000 Units Intravenous Once  . sodium chloride  3 mL Intravenous Q12H  . DISCONTD: cyclobenzaprine  5 mg Oral TID    Assessment: 20yo with LLE DVT. Noted plans for possible thrombolysis today. Plans for hypercoagulable work up in AM. Coumadin start being held off until Sx plans made. Heparin level below goal despite aggressive increase in heparin drip rate. H/H decr slightly, Plts ok.  Goal of Therapy:  Heparin level 0.3-0.7 units/ml   Plan: 1. Increase heparin drip to 1800 units/hr 2. Recheck heparin level at 1500  Ulric Salzman K. Allena Katz, PharmD, BCPS.  Clinical Pharmacist Pager [phone]. 08/03/2011 8:22 AM

## 2011-08-03 NOTE — Progress Notes (Signed)
Transferred to IR at 12:25 PM  for Lysis of left leg clot. Will transfer to ICU after.

## 2011-08-03 NOTE — Progress Notes (Signed)
Pharmacy: Re-Heparin  Assessment: Patient's now s/p IR  and lysis with Alteplase for DVT. Heparin level is still subtherapeutic at 0.12 despite rate changed to 1800 units/hr this morning.  Spoke to Dr. Deanne Coffer, OK to keep heparin goal range of 0.3-0.7.  Plan: 1) Increase heparin drip to 1950 units/hr 2) Recheck 6 hour heparin level  Dorna Leitz, PharmD, BCPS Clinical Pharmacist

## 2011-08-03 NOTE — H&P (Signed)
Willie Charles is an 20 y.o. male.   Chief Complaint:  LLE DVT documented by venous doppler exam HPI: 20 yo male adm' d to [facility] after being seen in ER at [facility] with unexplained LLE DVT. Lysis has been discussed amd planned for today. Described procedure to parents at length again today including risks and benefits. They would like to speak to Dr Deanne Coffer prior to signing consent.  Past Medical History  Diagnosis Date  . No pertinent past medical history     Past Surgical History  Procedure Date  . Mouth surgery   . No past surgeries     No family history on file. Social History:  reports that he has never smoked. He has never used smokeless tobacco. He reports that he drinks alcohol. He reports that he does not use illicit drugs.  Allergies: No Known Allergies  Medications Prior to Admission  Medication Dose Route Frequency Provider Last Rate Last Dose  . 0.9 %  sodium chloride infusion   Intravenous Once Langston Masker, [place] 1,000 mL/hr at 08/01/11 2034    . 0.9 %  sodium chloride infusion  250 mL Intravenous PRN Iskra Magick-Myers 10 mL/hr at 08/02/11 0251 250 mL at 08/02/11 0251  . 0.9 %  sodium chloride infusion   Intravenous Continuous Belkys Regalado, MD 100 mL/hr at 08/02/11 2043    . cyclobenzaprine (FLEXERIL) tablet 5 mg  5 mg Oral TID PRN Belkys Regalado, MD      . heparin ADULT infusion 100 units/ml (25000 units/250 ml)  1,800 Units/hr Intravenous Continuous Christella Hartigan, PHARMD 18 mL/hr at 08/03/11 0833 1,800 Units/hr at 08/03/11 8295  . heparin bolus via infusion 2,000 Units  2,000 Units Intravenous Once [facility], PHARMD   2,000 Units at 08/02/11 1655  . heparin bolus via infusion 2,000 Units  2,000 Units Intravenous Once Colleen Can, PHARMD   2,000 Units at 08/03/11 0115  . heparin bolus via infusion 4,000 Units  4,000 Units Intravenous Once Lyanne Co, MD   4,000 Units at 08/01/11 2100  . heparin bolus via infusion 4,000 Units  4,000  Units Intravenous Once Iskra Magick-Myers   4,000 Units at 08/02/11 0251  . HYDROcodone-acetaminophen (NORCO) 5-325 MG per tablet 2 tablet  2 tablet Oral Q4H PRN Iskra Magick-Myers   2 tablet at 08/02/11 2018  . HYDROmorphone (DILAUDID) injection 1 mg  1 mg Intravenous Q4H PRN Langston Masker, [place]   1 mg at 08/03/11 0801  . morphine 4 MG/ML injection 8 mg  8 mg Intravenous Once Lyanne Co, MD   4 mg at 08/01/11 2042  . promethazine (PHENERGAN) tablet 12.5 mg  12.5 mg Oral Q6H PRN Iskra Magick-Myers       Or  . promethazine (PHENERGAN) injection 12.5 mg  12.5 mg Intravenous Q6H PRN Iskra Magick-Myers      . sodium chloride 0.9 % injection 3 mL  3 mL Intravenous Q12H Iskra Magick-Myers   3 mL at 08/02/11 1011  . sodium chloride 0.9 % injection 3 mL  3 mL Intravenous PRN Iskra Magick-Myers   3 mL at 08/03/11 0801  . DISCONTD: 0.9 %  sodium chloride infusion   Intravenous Once Langston Masker, [place]      . DISCONTD: cyclobenzaprine (FLEXERIL) tablet 5 mg  5 mg Oral TID Iskra Magick-Myers   5 mg at 08/02/11 2129  . DISCONTD: enoxaparin (LOVENOX) injection 60 mg  60 mg Subcutaneous Once Langston Masker, [place]      .  DISCONTD: heparin bolus via infusion 4,000 Units  4,000 Units Intravenous Once Iskra Magick-Myers      . DISCONTD: ondansetron (ZOFRAN) injection 4 mg  4 mg Intravenous Once Langston Masker, [place]      . DISCONTD: oxyCODONE (Oxy IR/ROXICODONE) immediate release tablet 5 mg  5 mg Oral Q4H PRN Iskra Magick-Myers       No current outpatient prescriptions on file as of 08/03/2011.    Results for orders placed during the hospital encounter of 08/01/11 (from the past 48 hour(s))  CBC     Status: Normal   Collection Time   08/01/11  8:22 PM      Component Value Range Comment   WBC 8.0  4.0 - 10.5 (K/uL)    RBC 5.08  4.22 - 5.81 (MIL/uL)    Hemoglobin 14.8  13.0 - 17.0 (g/dL)    HCT 04.5  40.9 - 81.1 (%)    MCV 85.6  78.0 - 100.0 (fL)    MCH 29.1  26.0 - 34.0 (pg)    MCHC 34.0  30.0 - 36.0 (g/dL)    RDW 91.4   78.2 - 95.6 (%)    Platelets 309  150 - 400 (K/uL)   DIFFERENTIAL     Status: Normal   Collection Time   08/01/11  8:22 PM      Component Value Range Comment   Neutrophils Relative 74  43 - 77 (%)    Neutro Abs 5.9  1.7 - 7.7 (K/uL)    Lymphocytes Relative 17  12 - 46 (%)    Lymphs Abs 1.3  0.7 - 4.0 (K/uL)    Monocytes Relative 8  3 - 12 (%)    Monocytes Absolute 0.6  0.1 - 1.0 (K/uL)    Eosinophils Relative 2  0 - 5 (%)    Eosinophils Absolute 0.1  0.0 - 0.7 (K/uL)    Basophils Relative 0  0 - 1 (%)    Basophils Absolute 0.0  0.0 - 0.1 (K/uL)   COMPREHENSIVE METABOLIC PANEL     Status: Abnormal   Collection Time   08/01/11  8:22 PM      Component Value Range Comment   Sodium 135  135 - 145 (mEq/L)    Potassium 3.7  3.5 - 5.1 (mEq/L)    Chloride 95 (*) 96 - 112 (mEq/L)    CO2 28  19 - 32 (mEq/L)    Glucose, Bld 103 (*) 70 - 99 (mg/dL)    BUN 9  6 - 23 (mg/dL)    Creatinine, Ser 2.13  0.50 - 1.35 (mg/dL)    Calcium 9.8  8.4 - 10.5 (mg/dL)    Total Protein 7.8  6.0 - 8.3 (g/dL)    Albumin 4.3  3.5 - 5.2 (g/dL)    AST 22  0 - 37 (U/L)    ALT 17  0 - 53 (U/L)    Alkaline Phosphatase 72  39 - 117 (U/L)    Total Bilirubin 0.5  0.3 - 1.2 (mg/dL)    GFR calc non Af Amer >90  >90 (mL/min)    GFR calc Af Amer >90  >90 (mL/min)   PROTIME-INR     Status: Normal   Collection Time   08/01/11  8:32 PM      Component Value Range Comment   Prothrombin Time 14.2  11.6 - 15.2 (seconds)    INR 1.08  0.00 - 1.49    HEPARIN LEVEL (UNFRACTIONATED)     Status: Abnormal  Collection Time   08/02/11  1:45 AM      Component Value Range Comment   Heparin Unfractionated 0.25 (*) 0.30 - 0.70 (IU/mL)   MAGNESIUM     Status: Normal   Collection Time   08/02/11  1:45 AM      Component Value Range Comment   Magnesium 1.8  1.5 - 2.5 (mg/dL)   PHOSPHORUS     Status: Normal   Collection Time   08/02/11  1:45 AM      Component Value Range Comment   Phosphorus 3.2  2.3 - 4.6 (mg/dL)   TSH      Status: Normal   Collection Time   08/02/11  1:45 AM      Component Value Range Comment   TSH 2.946  0.350 - 4.500 (uIU/mL)   BASIC METABOLIC PANEL     Status: Abnormal   Collection Time   08/02/11  1:45 AM      Component Value Range Comment   Sodium 131 (*) 135 - 145 (mEq/L)    Potassium 4.1  3.5 - 5.1 (mEq/L)    Chloride 97  96 - 112 (mEq/L)    CO2 25  19 - 32 (mEq/L)    Glucose, Bld 117 (*) 70 - 99 (mg/dL)    BUN 8  6 - 23 (mg/dL)    Creatinine, Ser 1.61  0.50 - 1.35 (mg/dL)    Calcium 9.3  8.4 - 10.5 (mg/dL)    GFR calc non Af Amer >90  >90 (mL/min)    GFR calc Af Amer >90  >90 (mL/min)   HEPARIN LEVEL (UNFRACTIONATED)     Status: Normal   Collection Time   08/02/11  9:16 AM      Component Value Range Comment   Heparin Unfractionated 0.47  0.30 - 0.70 (IU/mL)   HEPARIN LEVEL (UNFRACTIONATED)     Status: Abnormal   Collection Time   08/02/11  3:02 PM      Component Value Range Comment   Heparin Unfractionated 0.22 (*) 0.30 - 0.70 (IU/mL)   ANTITHROMBIN III     Status: Normal   Collection Time   08/02/11  5:48 PM      Component Value Range Comment   AntiThromb III Func 75  75 - 120 (%)   HOMOCYSTEINE, SERUM     Status: Normal   Collection Time   08/02/11  5:48 PM      Component Value Range Comment   Homocysteine-Norm 8.1  4.0 - 15.4 (umol/L)   HEPARIN LEVEL (UNFRACTIONATED)     Status: Abnormal   Collection Time   08/02/11 11:10 PM      Component Value Range Comment   Heparin Unfractionated <0.10 (*) 0.30 - 0.70 (IU/mL)   URINE RAPID DRUG SCREEN (HOSP PERFORMED)     Status: Abnormal   Collection Time   08/03/11  3:25 AM      Component Value Range Comment   Opiates POSITIVE (*) NONE DETECTED     Cocaine NONE DETECTED  NONE DETECTED     Benzodiazepines NONE DETECTED  NONE DETECTED     Amphetamines NONE DETECTED  NONE DETECTED     Tetrahydrocannabinol NONE DETECTED  NONE DETECTED     Barbiturates NONE DETECTED  NONE DETECTED    CBC     Status: Abnormal    Collection Time   08/03/11  6:15 AM      Component Value Range Comment   WBC 7.1  4.0 - 10.5 (K/uL)  RBC 4.43  4.22 - 5.81 (MIL/uL)    Hemoglobin 13.0  13.0 - 17.0 (g/dL)    HCT 16.1 (*) 09.6 - 52.0 (%)    MCV 87.1  78.0 - 100.0 (fL)    MCH 29.3  26.0 - 34.0 (pg)    MCHC 33.7  30.0 - 36.0 (g/dL)    RDW 04.5  40.9 - 81.1 (%)    Platelets 301  150 - 400 (K/uL)   BASIC METABOLIC PANEL     Status: Abnormal   Collection Time   08/03/11  6:15 AM      Component Value Range Comment   Sodium 135  135 - 145 (mEq/L)    Potassium 4.2  3.5 - 5.1 (mEq/L)    Chloride 98  96 - 112 (mEq/L)    CO2 28  19 - 32 (mEq/L)    Glucose, Bld 105 (*) 70 - 99 (mg/dL)    BUN 8  6 - 23 (mg/dL)    Creatinine, Ser 9.14  0.50 - 1.35 (mg/dL)    Calcium 9.0  8.4 - 10.5 (mg/dL)    GFR calc non Af Amer >90  >90 (mL/min)    GFR calc Af Amer >90  >90 (mL/min)   HEPARIN LEVEL (UNFRACTIONATED)     Status: Abnormal   Collection Time   08/03/11  6:15 AM      Component Value Range Comment   Heparin Unfractionated 0.27 (*) 0.30 - 0.70 (IU/mL)    No results found.  Review of Systems  Constitutional: Positive for fever. Negative for chills and weight loss.  Respiratory: Negative for cough, shortness of breath and wheezing.   Cardiovascular: Negative for chest pain.  Endo/Heme/Allergies: Does not bruise/bleed easily.    Blood pressure 118/72, pulse 99, temperature 98.4 F (36.9 C), temperature source Oral, resp. rate 18, height 6' (1.829 m), weight 123 lb (55.792 kg), SpO2 100.00%. Physical Exam  Heent - unremarkable Airway -1 Heart - RRR increased HR Lungs - clear Abd - soft NT Exts - LLE edema - sl increased from yesterday. PT pulse palpable.  Assessment/Plan Probable lysis of LLE DVT today Dr Deanne Coffer.  Willie Charles 08/03/2011, 9:11 AM

## 2011-08-03 NOTE — Progress Notes (Signed)
ANTICOAGULATION CONSULT NOTE - Follow Up Consult  Pharmacy Consult for Heparin Indication: DVT  No Known Allergies  Patient Measurements: Height: 6' (182.9 cm) Weight: 123 lb (55.792 kg) IBW/kg (Calculated) : 77.6   Vital Signs: Temp: 100 F (37.8 C) (12/22 2051) Temp src: Oral (12/22 2051) BP: 116/70 mmHg (12/22 2051) Pulse Rate: 100  (12/22 2051)  Labs:  Basename 08/02/11 2310 08/02/11 1502 08/02/11 0916 08/02/11 0145 08/01/11 2032 08/01/11 2022  HGB -- -- -- -- -- 14.8  HCT -- -- -- -- -- 43.5  PLT -- -- -- -- -- 309  APTT -- -- -- -- -- --  LABPROT -- -- -- -- 14.2 --  INR -- -- -- -- 1.08 --  HEPARINUNFRC <0.10* 0.22* 0.47 -- -- --  CREATININE -- -- -- 0.66 -- 0.70  CKTOTAL -- -- -- -- -- --  CKMB -- -- -- -- -- --  TROPONINI -- -- -- -- -- --   Estimated Creatinine Clearance: 116.3 ml/min (by C-G formula based on Cr of 0.66).   Medications:  Scheduled:     . cyclobenzaprine  5 mg Oral TID  . heparin  2,000 Units Intravenous Once  . heparin  2,000 Units Intravenous Once  . heparin  4,000 Units Intravenous Once  . sodium chloride  3 mL Intravenous Q12H  . DISCONTD: sodium chloride   Intravenous Once  . DISCONTD: heparin  4,000 Units Intravenous Once  . DISCONTD: ondansetron  4 mg Intravenous Once    Assessment: 20yo male now undetectable on heparin despite detectable levels and rate increases previously; spoke with RN who says no issues with infusion.   Goal of Therapy:  Heparin level 0.3-0.7 units/ml   Plan:  Will give another 2000 unit bolus and increase gtt aggressively to 1600 units/hr and check level in 6hr.  Colleen Can PharmD BCPS 08/03/2011,1:08 AM

## 2011-08-03 NOTE — Consult Note (Signed)
[facility]  Patient name: Willie Charles MRN: [id number] DOB: 02-Jan-1991 Sex: male  REASON FOR CONSULT: Deep venous thrombosis of left lower extremity.  HPI: Willie Charles is a 20 y.o. 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 He was flying home from [place] approximately a week ago and soon after noticed some pain in his left groin and swelling in the left lower extremity. This progressed. Duplex scan confirmed a left lower extremity deep venous thrombosis. Vascular surgery is consult and for further recommendations.  The patient has had no previous problems with DVT, phlebitis, or varicose veins. He does have one grandmother who had a history of DVT. He is unaware of any other history of hypercoagulable conditions in his family.  He does not know of any trauma to the lower extremity and his only real risk factor for DVT is the recent travel where he was on a 90 minute slight from [place]. He has also had some low back pain although this is likely related to having to stand in an awkward position for prolonged time as part of a photography project approximately 2 weeks ago.  Past Medical History  Diagnosis Date  . No pertinent past medical history   He denies any history of diabetes, hypertension, cardiac problems, or pulmonary problems.  No family history on file.  SOCIAL HISTORY: History  Substance Use Topics  . Smoking status: Never Smoker   . Smokeless tobacco: Never Used  . Alcohol Use: Yes     occasional    No Known Allergies  Current Facility-Administered Medications  Medication Dose Route Frequency Provider Last Rate Last Dose  . 0.9 %  sodium chloride infusion  250 mL Intravenous PRN Iskra Magick-Myers 10 mL/hr at 08/02/11 0251 250 mL at 08/02/11 0251  . 0.9 %  sodium chloride infusion   Intravenous Continuous Belkys Regalado, MD 100 mL/hr at 08/02/11 2043    . cyclobenzaprine (FLEXERIL) tablet 5 mg  5 mg Oral TID PRN Belkys Regalado, MD      .  heparin ADULT infusion 100 units/ml (25000 units/250 ml)  1,600 Units/hr Intravenous Continuous Colleen Can, PHARMD 16 mL/hr at 08/03/11 0436 1,600 Units/hr at 08/03/11 0436  . heparin bolus via infusion 2,000 Units  2,000 Units Intravenous Once [facility], PHARMD   2,000 Units at 08/02/11 1655  . heparin bolus via infusion 2,000 Units  2,000 Units Intravenous Once Colleen Can, PHARMD   2,000 Units at 08/03/11 0115  . HYDROcodone-acetaminophen (NORCO) 5-325 MG per tablet 2 tablet  2 tablet Oral Q4H PRN Iskra Magick-Myers   2 tablet at 08/02/11 2018  . HYDROmorphone (DILAUDID) injection 1 mg  1 mg Intravenous Q4H PRN Langston Masker, [place]   1 mg at 08/02/11 1548  . promethazine (PHENERGAN) tablet 12.5 mg  12.5 mg Oral Q6H PRN Iskra Magick-Myers       Or  . promethazine (PHENERGAN) injection 12.5 mg  12.5 mg Intravenous Q6H PRN Iskra Magick-Myers      . sodium chloride 0.9 % injection 3 mL  3 mL Intravenous Q12H Iskra Magick-Myers   3 mL at 08/02/11 1011  . sodium chloride 0.9 % injection 3 mL  3 mL Intravenous PRN Iskra Magick-Myers      . DISCONTD: cyclobenzaprine (FLEXERIL) tablet 5 mg  5 mg Oral TID Iskra Magick-Myers   5 mg at 08/02/11 2129    REVIEW OF SYSTEMS: Arly.Keller ] denotes positive finding; [  ] denotes negative finding CARDIOVASCULAR:  [ ]   chest pain   [ ]  chest pressure   [ ]  palpitations   [ ]  orthopnea   [ ]  dyspnea on exertion   [ ]  claudication   [ ]  rest pain   [ ]  DVT   [ ]  phlebitis PULMONARY:   [ ]  productive cough   [ ]  asthma   [ ]  wheezing NEUROLOGIC:   [ ]  weakness  [ ]  paresthesias  [ ]  aphasia  [ ]  amaurosis  [ ]  dizziness HEMATOLOGIC:   [ ]  bleeding problems   [ ]  clotting disorders MUSCULOSKELETAL:  [ ]  joint pain   [ ]  joint swelling [ ]  leg swelling GASTROINTESTINAL: [ ]   blood in stool  [ ]   hematemesis GENITOURINARY:  [ ]   dysuria  [ ]   hematuria PSYCHIATRIC:  [ ]  history of major depression INTEGUMENTARY:  [ ]  rashes  [ ]   ulcers CONSTITUTIONAL:  [ ]  fever   [ ]  chills  PHYSICAL EXAM: Filed Vitals:   08/02/11 0601 08/02/11 1343 08/02/11 2051 08/03/11 0425  BP: 117/68 116/75 116/70 118/72  Pulse: 100 87 100 99  Temp: 98.3 F (36.8 C) 99.6 F (37.6 C) 100 F (37.8 C) 98.4 F (36.9 C)  TempSrc: Oral Oral Oral Oral  Resp: 18 16 18 18   Height:      Weight:      SpO2: 100% 96% 95% 100%   Body mass index is 16.68 kg/(m^2). GENERAL: The patient is a well-nourished male, in no acute distress. The vital signs are documented above. CARDIOVASCULAR: There is a regular rate and rhythm without significant murmur appreciated. He has no carotid bruits. He has palpable femoral pulses. He has a palpable dorsalis pedis and posterior tibial pulse on the right. He hasn't diminished posterior tibial pulse in the left. He has a biphasic posterior tibial signal with the Doppler on the left and a monophasic anterior tibial signal with the Doppler. He has moderate swelling of the left lower extremity. Both feet appear well perfused. PULMONARY: There is good air exchange bilaterally without wheezing or rales. ABDOMEN: Soft and non-tender with normal pitched bowel sounds.  MUSCULOSKELETAL: the cath is relatively soft with no evidence of compartment syndrome.NEUROLOGIC: No focal weakness or paresthesias are detected. SKIN: There are no ulcers or rashes noted. PSYCHIATRIC: The patient has a normal affect.  DATA:  Lab Results  Component Value Date   WBC 7.1 08/03/2011   HGB 13.0 08/03/2011   HCT 38.6* 08/03/2011   MCV 87.1 08/03/2011   PLT 301 08/03/2011   Lab Results  Component Value Date   NA 131* 08/02/2011   K 4.1 08/02/2011   CL 97 08/02/2011   CO2 25 08/02/2011   Lab Results  Component Value Date   CREATININE 0.66 08/02/2011   Lab Results  Component Value Date   INR 1.08 08/01/2011   No results found for this basename: HGBA1C   CBG (last 3)  No results found for this basename: GLUCAP:3 in the last 72  hours  I have reviewed his venous duplex scan which shows evidence of acute DVT involving the left common femoral vein femoral vein and popliteal veins extending to the posterior tibial vein.  MEDICAL ISSUES: 1. DVT of left lower extremity. Patient is currently on heparin. Interventional radiology has been consulted for potential thrombolysis. Given his young age I would favor an aggressive approach if interventional radiology feels that this is appropriate to lower his risk of long-term post phlebitic complications such as leg swelling,  varicose veins, and ulceration. In addition, with thrombolysis it may be possible to further assess the proximal veins to help determine why he would have a DVT at such a young age. 2. In addition, he should have a hypercoagulable workup. I would consider hematologic consult Monday in order to determine the best timing for this. It may be best not to obtain a hypercoagulable panel during the acute phase. 3. I would continue heparin and leg elevation. I have discussed with the patient and his family the importance of elevating the leg correctly. The back should be flat, the knee slightly bent, the ankle above the knee, and the knee above the hip. 4. I will be out of town next week. If further vascular input is needed, might partners will be covering in the office can obtain the physician on call. The office number is [phone].  DICKSON,CHRISTOPHER S [facility]: [phone]

## 2011-08-04 ENCOUNTER — Other Ambulatory Visit (HOSPITAL_COMMUNITY): Payer: Self-pay | Admitting: Interventional Radiology

## 2011-08-04 DIAGNOSIS — I82409 Acute embolism and thrombosis of unspecified deep veins of unspecified lower extremity: Secondary | ICD-10-CM

## 2011-08-04 LAB — BASIC METABOLIC PANEL
BUN: 9 mg/dL (ref 6–23)
Creatinine, Ser: 0.71 mg/dL (ref 0.50–1.35)
GFR calc non Af Amer: >90 mL/min (ref >90)
Glucose, Bld: 94 mg/dL (ref 70–99)
Potassium: 4.1 mEq/L (ref 3.5–5.1)

## 2011-08-04 LAB — CARDIOLIPIN ANTIBODIES, IGG, IGM, IGA
Anticardiolipin IgA: 14 APL U/mL (ref <22)
Anticardiolipin IgG: 9 GPL U/mL — ABNORMAL LOW (ref <23)
Anticardiolipin IgM: 4 MPL U/mL — ABNORMAL LOW (ref <11)

## 2011-08-04 LAB — CBC
HCT: 38 % — ABNORMAL LOW (ref 39.0–52.0)
Hemoglobin: 12.7 g/dL — ABNORMAL LOW (ref 13.0–17.0)
MCHC: 33.4 g/dL (ref 30.0–36.0)
MCV: 87.2 fL (ref 78.0–100.0)
RDW: 12.4 % (ref 11.5–15.5)

## 2011-08-04 LAB — PROTEIN S, TOTAL: Protein S Ag, Total: 98 % (ref 60–150)

## 2011-08-04 LAB — PROTEIN C, TOTAL: Protein C, Total: 42 % — ABNORMAL LOW (ref 72–160)

## 2011-08-04 LAB — LUPUS ANTICOAGULANT PANEL: DRVVT: 47.4 secs — ABNORMAL HIGH (ref 34.1–42.2)

## 2011-08-04 LAB — PROTEIN S ACTIVITY: Protein S Activity: 83 % (ref 69–129)

## 2011-08-04 LAB — HEPARIN LEVEL (UNFRACTIONATED): Heparin Unfractionated: 0.52 IU/mL (ref 0.30–0.70)

## 2011-08-04 MED ORDER — ASPIRIN 81 MG PO CHEW: 81.0000 mg | CHEWABLE_TABLET | Freq: Every day | ORAL | Status: DC

## 2011-08-04 MED ORDER — ENOXAPARIN (LOVENOX) PATIENT EDUCATION KIT
PACK | Freq: Once | Status: DC
  Filled 2011-08-04: qty 1

## 2011-08-04 MED ORDER — WARFARIN SODIUM 5 MG PO TABS
7.5000 mg | ORAL_TABLET | Freq: Every day | ORAL | Status: DC
  Filled 2011-08-04: qty 1

## 2011-08-04 MED ORDER — CYCLOBENZAPRINE HCL 5 MG PO TABS: 5.0000 mg | ORAL_TABLET | Freq: Three times a day (TID) | ORAL | Status: DC | PRN

## 2011-08-04 MED ORDER — WARFARIN SODIUM 5 MG PO TABS: 7.5000 mg | ORAL_TABLET | Freq: Every day | ORAL | Status: DC

## 2011-08-04 MED ORDER — WARFARIN VIDEO: Freq: Once | Status: DC

## 2011-08-04 MED ORDER — ENOXAPARIN SODIUM 80 MG/0.8ML ~~LOC~~ SOLN
80.0000 mg | SUBCUTANEOUS | Status: DC
  Administered 2011-08-04: 80 mg via SUBCUTANEOUS
  Filled 2011-08-04: qty 0.8

## 2011-08-04 MED ORDER — ENOXAPARIN SODIUM 80 MG/0.8ML ~~LOC~~ SOLN: 80.0000 mg | SUBCUTANEOUS | Status: DC

## 2011-08-04 MED ORDER — PATIENT'S GUIDE TO USING COUMADIN BOOK
Freq: Once | Status: DC
  Filled 2011-08-04: qty 1

## 2011-08-04 NOTE — Progress Notes (Signed)
Utilization review completed.  

## 2011-08-04 NOTE — Progress Notes (Signed)
ANTICOAGULATION CONSULT NOTE - Follow Up Consult  Pharmacy Consult for heparin Indication: DVT  No Known Allergies  Patient Measurements: Height: 6' (182.9 cm) Weight: 123 lb (55.792 kg) IBW/kg (Calculated) : 77.6   Vital Signs: BP: 113/58 mmHg (12/23 2009) Pulse Rate: 90  (12/23 2009)  Labs:  Basename 08/03/11 2246 08/03/11 1522 08/03/11 0615 08/02/11 0145 08/01/11 2032 08/01/11 2022  HGB -- -- 13.0 -- -- 14.8  HCT -- -- 38.6* -- -- 43.5  PLT -- -- 301 -- -- 309  APTT -- -- -- -- -- --  LABPROT -- -- -- -- 14.2 --  INR -- -- -- -- 1.08 --  HEPARINUNFRC 0.43 0.12* 0.27* -- -- --  CREATININE -- -- 0.68 0.66 -- 0.70  CKTOTAL -- -- -- -- -- --  CKMB -- -- -- -- -- --  TROPONINI -- -- -- -- -- --   Estimated Creatinine Clearance: 116.3 ml/min (by C-G formula based on Cr of 0.68).   Medications:  Scheduled:    . alteplase  5 mg Intracatheter Once  . fentaNYL      . heparin  2,000 Units Intravenous Once  . midazolam      . sodium chloride  3 mL Intravenous Q12H  . DISCONTD: alteplase (TPA-ACTIVASE) *DIALYSIS CATH* 5 mg infusion  5 mg Intravenous Once  . DISCONTD: cyclobenzaprine  5 mg Oral TID   Infusions:    . sodium chloride 100 mL/hr at 08/03/11 1658  . heparin 1,950 Units/hr (08/03/11 2021)    Assessment: 20yo male now therapeutic on heparin after multiple rate increases.  Goal of Therapy:  Heparin level 0.3-0.7 units/ml   Plan:  Will continue current heparin rate and confirm stable with am labs.  Colleen Can PharmD BCPS 08/04/2011,12:42 AM

## 2011-08-04 NOTE — Discharge Summary (Signed)
Admit date: 08/01/2011 Discharge date: 08/04/2011  Primary Care Physician:  No primary provider on file.   Discharge Diagnoses:   Left lower extremity DVT May Thurner syndrome Mild Hyponatremia.   DISCHARGE MEDICATION: Current Discharge Medication List    START taking these medications   Details  enoxaparin (LOVENOX) 80 MG/0.8ML SOLN injection Inject 0.8 mLs (80 mg total) into the skin daily. Qty: 7 Syringe, Refills: 0    warfarin (COUMADIN) 5 MG tablet Take 1.5 tablets (7.5 mg total) by mouth daily at 6 PM. Qty: 60 tablet, Refills: 0      CONTINUE these medications which have CHANGED   Details  cyclobenzaprine (FLEXERIL) 5 MG tablet Take 1 tablet (5 mg total) by mouth 3 (three) times daily as needed for muscle spasms. Qty: 30 tablet, Refills: 0      CONTINUE these medications which have NOT CHANGED   Details  HYDROcodone-acetaminophen (VICODIN) 5-500 MG per tablet Take 1 tablet by mouth every 4 (four) hours as needed. For pain            Consults: Treatment Team:  Chuck Hint, MD   SIGNIFICANT DIAGNOSTIC STUDIES:  Ir Veno/ext/uni Left  08/04/2011  *RADIOLOGY REPORT*  Clinical data:  Extensive occlusive left lower extremity DVT.  LEFT LOWER EXTREMITY VENOGRAM MECHANICAL THROMBECTOMY VENOUS ANGIOPLASTY VASCULAR STENT PLACEMENT ULTRASOUND GUIDANCE FOR VASCULAR ACCESS  Comparison:  none  Technique and findings: The procedure, risks (including but not limited to bleeding, infection, organ damage), benefits, and alternatives were explained to the patient and  parents.  Questions regarding the procedure were encouraged and answered.  They understand and consent to the procedure.  The patient placed prone.  The left popliteal region was prepped and draped in usual sterile fashion. Maximal barrier sterile technique was utilized including caps, mask, sterile gowns, sterile gloves, sterile drape, hand hygiene and skin antiseptic.  Intravenous Fentanyl and Versed were  administered as conscious sedation during continuous cardiorespiratory monitoring by the radiology RN, with a total moderate sedation time of 105 minutes.  Under real time ultrasound guidance after the skin was infiltrated with 1% lidocaine, the left short saphenous vein was accessed with a 21-gauge micropuncture needle, exchanged over a 0.018-inch guide wire for a transitional dilator.  Lower extremity venography was performed, demonstrating occlusive thrombus at the level of the left common femoral vein.  The dilator was exchanged over a Benson wire for a 6-French vascular sheath, through which a 5-French Kumpe catheter was advanced for additional venography, demonstrating occlusive thrombus extending through the left iliac venous system to its confluence with the IVC.  There is a high-grade stenosis at the confluence of the left common iliac vein with the IVC.  The catheter was exchanged for  the AngioJet catheter for mechanical thrombolysis of the iliofemoral DVT.  Follow-up venography showed partial resolution without significant improvement in antegrade straight line flow.  For this reason, 5 mg TPA were administered using pulse spray through the residual clot.  After 30 minutes, additional mechanical thrombectomy was performed.  There was partial clearance of thrombus.  Because of a high grade central occlusion, a 12 mm x 4 cm Atlas angioplasty balloon was advanced after the sheath was upsized to 7-French.  This was used to macerate residual clot in the iliac venous system and dilate the stenosis in the common iliac vein.  This improved the straight line flow.  There was still significant irregularity and narrowing at the confluence of the IVC.  For this reason, a 14 mm x  60 mm Zilver stent was deployed across the lesion extending to the IVC.  This was dilated with the 12 mm balloon.  Final venography shows good antegrade flow through the iliofemoral system.  There is a small amount of residual mural thrombus  which is nonocclusive. Guide wire sheath were removed and hemostasis achieved at the site.  The patient tolerated the procedure well.    No immediate complication.  IMPRESSION 1.  Extensive occlusive left iliofemoral DVT. 2.  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 3.  Technically successful mechanical and pharmacologic thrombolysis of the left iliofemoral DVT. 4.  Technically successful stent assisted angioplasty of the left common iliac vein stenosis.  Recommend continuation of full anticoagulation to allow lysis of residual nonocclusive iliac thrombus, and lifelong aspirin therapy.  Original Report Authenticated By: Osa Craver, M.D.   Ir Pta Venous Left  08/04/2011  *RADIOLOGY REPORT*  Clinical data:  Extensive occlusive left lower extremity DVT.  LEFT LOWER EXTREMITY VENOGRAM MECHANICAL THROMBECTOMY VENOUS ANGIOPLASTY VASCULAR STENT PLACEMENT ULTRASOUND GUIDANCE FOR VASCULAR ACCESS  Comparison:  none  Technique and findings: The procedure, risks (including but not limited to bleeding, infection, organ damage), benefits, and alternatives were explained to the patient and  parents.  Questions regarding the procedure were encouraged and answered.  They understand and consent to the procedure.  The patient placed prone.  The left popliteal region was prepped and draped in usual sterile fashion. Maximal barrier sterile technique was utilized including caps, mask, sterile gowns, sterile gloves, sterile drape, hand hygiene and skin antiseptic.  Intravenous Fentanyl and Versed were administered as conscious sedation during continuous cardiorespiratory monitoring by the radiology RN, with a total moderate sedation time of 105 minutes.  Under real time ultrasound guidance after the skin was infiltrated with 1% lidocaine, the left short saphenous vein was accessed with a 21-gauge micropuncture needle, exchanged over a 0.018-inch  guide wire for a transitional dilator.  Lower extremity venography was performed, demonstrating occlusive thrombus at the level of the left common femoral vein.  The dilator was exchanged over a Benson wire for a 6-French vascular sheath, through which a 5-French Kumpe catheter was advanced for additional venography, demonstrating occlusive thrombus extending through the left iliac venous system to its confluence with the IVC.  There is a high-grade stenosis at the confluence of the left common iliac vein with the IVC.  The catheter was exchanged for  the AngioJet catheter for mechanical thrombolysis of the iliofemoral DVT.  Follow-up venography showed partial resolution without significant improvement in antegrade straight line flow.  For this reason, 5 mg TPA were administered using pulse spray through the residual clot.  After 30 minutes, additional mechanical thrombectomy was performed.  There was partial clearance of thrombus.  Because of a high grade central occlusion, a 12 mm x 4 cm Atlas angioplasty balloon was advanced after the sheath was upsized to 7-French.  This was used to macerate residual clot in the iliac venous system and dilate the stenosis in the common iliac vein.  This improved the straight line flow.  There was still significant irregularity and narrowing at the confluence of the IVC.  For this reason, a 14 mm x 60 mm Zilver stent was deployed across the lesion extending to the IVC.  This was dilated with the 12 mm balloon.  Final venography shows good antegrade flow through the iliofemoral system.  There is a small amount of  residual mural thrombus which is nonocclusive. Guide wire sheath were removed and hemostasis achieved at the site.  The patient tolerated the procedure well.    No immediate complication.  IMPRESSION 1.  Extensive occlusive left iliofemoral DVT. 2.  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 3.   Technically successful mechanical and pharmacologic thrombolysis of the left iliofemoral DVT. 4.  Technically successful stent assisted angioplasty of the left common iliac vein stenosis.  Recommend continuation of full anticoagulation to allow lysis of residual nonocclusive iliac thrombus, and lifelong aspirin therapy.  Original Report Authenticated By: Osa Craver, M.D.   Ir Thromboect Veno Mech  08/04/2011  *RADIOLOGY REPORT*  Clinical data:  Extensive occlusive left lower extremity DVT.  LEFT LOWER EXTREMITY VENOGRAM MECHANICAL THROMBECTOMY VENOUS ANGIOPLASTY VASCULAR STENT PLACEMENT ULTRASOUND GUIDANCE FOR VASCULAR ACCESS  Comparison:  none  Technique and findings: The procedure, risks (including but not limited to bleeding, infection, organ damage), benefits, and alternatives were explained to the patient and  parents.  Questions regarding the procedure were encouraged and answered.  They understand and consent to the procedure.  The patient placed prone.  The left popliteal region was prepped and draped in usual sterile fashion. Maximal barrier sterile technique was utilized including caps, mask, sterile gowns, sterile gloves, sterile drape, hand hygiene and skin antiseptic.  Intravenous Fentanyl and Versed were administered as conscious sedation during continuous cardiorespiratory monitoring by the radiology RN, with a total moderate sedation time of 105 minutes.  Under real time ultrasound guidance after the skin was infiltrated with 1% lidocaine, the left short saphenous vein was accessed with a 21-gauge micropuncture needle, exchanged over a 0.018-inch guide wire for a transitional dilator.  Lower extremity venography was performed, demonstrating occlusive thrombus at the level of the left common femoral vein.  The dilator was exchanged over a Benson wire for a 6-French vascular sheath, through which a 5-French Kumpe catheter was advanced for additional venography, demonstrating occlusive  thrombus extending through the left iliac venous system to its confluence with the IVC.  There is a high-grade stenosis at the confluence of the left common iliac vein with the IVC.  The catheter was exchanged for  the AngioJet catheter for mechanical thrombolysis of the iliofemoral DVT.  Follow-up venography showed partial resolution without significant improvement in antegrade straight line flow.  For this reason, 5 mg TPA were administered using pulse spray through the residual clot.  After 30 minutes, additional mechanical thrombectomy was performed.  There was partial clearance of thrombus.  Because of a high grade central occlusion, a 12 mm x 4 cm Atlas angioplasty balloon was advanced after the sheath was upsized to 7-French.  This was used to macerate residual clot in the iliac venous system and dilate the stenosis in the common iliac vein.  This improved the straight line flow.  There was still significant irregularity and narrowing at the confluence of the IVC.  For this reason, a 14 mm x 60 mm Zilver stent was deployed across the lesion extending to the IVC.  This was dilated with the 12 mm balloon.  Final venography shows good antegrade flow through the iliofemoral system.  There is a small amount of residual mural thrombus which is nonocclusive. Guide wire sheath were removed and hemostasis achieved at the site.  The patient tolerated the procedure well.    No immediate complication.  IMPRESSION 1.  Extensive occlusive left iliofemoral DVT. 2.  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 3.  Technically successful mechanical and pharmacologic thrombolysis of the left iliofemoral DVT. 4.  Technically successful stent assisted angioplasty of the left common iliac vein stenosis.  Recommend continuation of full anticoagulation to allow lysis of residual nonocclusive iliac thrombus, and lifelong aspirin therapy.  Original Report  Authenticated By: Osa Craver, M.D.   Ir US Guide Vasc Access Left  08/04/2011  *RADIOLOGY REPORT*  Clinical data:  Extensive occlusive left lower extremity DVT.  LEFT LOWER EXTREMITY VENOGRAM MECHANICAL THROMBECTOMY VENOUS ANGIOPLASTY VASCULAR STENT PLACEMENT ULTRASOUND GUIDANCE FOR VASCULAR ACCESS  Comparison:  none  Technique and findings: The procedure, risks (including but not limited to bleeding, infection, organ damage), benefits, and alternatives were explained to the patient and  parents.  Questions regarding the procedure were encouraged and answered.  They understand and consent to the procedure.  The patient placed prone.  The left popliteal region was prepped and draped in usual sterile fashion. Maximal barrier sterile technique was utilized including caps, mask, sterile gowns, sterile gloves, sterile drape, hand hygiene and skin antiseptic.  Intravenous Fentanyl and Versed were administered as conscious sedation during continuous cardiorespiratory monitoring by the radiology RN, with a total moderate sedation time of 105 minutes.  Under real time ultrasound guidance after the skin was infiltrated with 1% lidocaine, the left short saphenous vein was accessed with a 21-gauge micropuncture needle, exchanged over a 0.018-inch guide wire for a transitional dilator.  Lower extremity venography was performed, demonstrating occlusive thrombus at the level of the left common femoral vein.  The dilator was exchanged over a Benson wire for a 6-French vascular sheath, through which a 5-French Kumpe catheter was advanced for additional venography, demonstrating occlusive thrombus extending through the left iliac venous system to its confluence with the IVC.  There is a high-grade stenosis at the confluence of the left common iliac vein with the IVC.  The catheter was exchanged for  the AngioJet catheter for mechanical thrombolysis of the iliofemoral DVT.  Follow-up venography showed partial resolution  without significant improvement in antegrade straight line flow.  For this reason, 5 mg TPA were administered using pulse spray through the residual clot.  After 30 minutes, additional mechanical thrombectomy was performed.  There was partial clearance of thrombus.  Because of a high grade central occlusion, a 12 mm x 4 cm Atlas angioplasty balloon was advanced after the sheath was upsized to 7-French.  This was used to macerate residual clot in the iliac venous system and dilate the stenosis in the common iliac vein.  This improved the straight line flow.  There was still significant irregularity and narrowing at the confluence of the IVC.  For this reason, a 14 mm x 60 mm Zilver stent was deployed across the lesion extending to the IVC.  This was dilated with the 12 mm balloon.  Final venography shows good antegrade flow through the iliofemoral system.  There is a small amount of residual mural thrombus which is nonocclusive. Guide wire sheath were removed and hemostasis achieved at the site.  The patient tolerated the procedure well.    No immediate complication.  IMPRESSION 1.  Extensive occlusive left iliofemoral DVT. 2.  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 3.  Technically successful mechanical and pharmacologic thrombolysis of the left iliofemoral DVT. 4.  Technically successful stent assisted angioplasty  of the left common iliac vein stenosis.  Recommend continuation of full anticoagulation to allow lysis of residual nonocclusive iliac thrombus, and lifelong aspirin therapy.  Original Report Authenticated By: Osa Craver, M.D.     BRIEF ADMITTING H & P: Patient is a 20 y.o. male who presens with left LE pain and swelling that he first noticed approximately one week prior to admission and has been getting progressively worse. He describes pain as sharp, non radiating, with no specific aggravating or alleviating  factors, 7/10 in severity, no similar episodes in the past. He denies any trauma to the area. Currently he has difficulty ambulating due to pain. He denies fevers, chills, no other systemic concerns, no abdominal or urinary concerns. He denies recent sicknesses or hospitalizations, no LE weakness or numbness, no tingling, no warmth to touch.  Hospital Course: Left lower extremity DVT Patient presents with left lower extremity pain and cyanosis with ambulation. Doppler of lower extremity confirm acute, occlusive deep vein thrombosis noted throughout the left lower extremity, beginning in the posterior tibial and coursing through to the popliteal, femoral and common femoral veins. Patient was started on heparin. Vascular and  interventional radiology were consulted. Patient had  left lower extremity venogram, mechanical  thrombectomy, venous angioplasty vascular stent placement by ultrasound guidance for vascular access. Patient was subsequently transitioned to Lovenox and he was started on Coumadin. He was discharge on Lovenox and coumadin. He will need INR within 48 hour. He will need to continue on Lovenox for 24 hour after INR therapeutic. Hypercoagulable Work up was order before we got diagnosis of May Thurner Syndrome. Of note patient was on heparin at time hypercoagulable panel was drawn. Patient will be refer to hematology for follow up of results.  Patient will need aspirin life long. He will need coumadin probably for 6 month, but he will need to follow with hematology for hypercoagulable work up.   May Thurner syndrome: Diagnosed by venogram. S/P stent placement. Life long aspirin. Needs to follow up with IR in 6 Month.   Mild Hyponatremia: Resolved with IVF.       Disposition and Follow-up:  Discharge Orders    Future Orders Please Complete By Expires   Diet general      Increase activity slowly        Follow-up Information    Follow up in 2 days. (You will need INR check on the 26. )            DISCHARGE EXAM:  General: Alert, awake, oriented x3, in no acute distress.  HEENT: No bruits, no goiter.  Heart: Regular rate and rhythm, without murmurs, rubs, gallops.  Lungs: CTA, bilateral air movement.  Abdomen: Soft, nontender, nondistended, positive bowel sounds.  Neuro: Grossly intact, nonfocal.  Extremities: left lower extremities with swelling, no cyanosis.    Blood pressure 113/69, pulse 80, temperature 98.7 F (37.1 C), temperature source Oral, resp. rate 19, height 6' (1.829 m), weight 55.792 kg (123 lb), SpO2 100.00%.   Basename 08/04/11 0500 08/03/11 0615 08/02/11 0145  NA 135 135 --  K 4.1 4.2 --  CL 98 98 --  CO2 29 28 --  GLUCOSE 94 105* --  BUN 9 8 --  CREATININE 0.71 0.68 --  CALCIUM 9.1 9.0 --  MG -- -- 1.8  PHOS -- -- 3.2    Basename 08/01/11 2022  AST 22  ALT 17  ALKPHOS 72  BILITOT 0.5  PROT 7.8  ALBUMIN 4.3  Basename 08/04/11 0500 08/03/11 0615 08/01/11 2022  WBC 5.7 7.1 --  NEUTROABS -- -- 5.9  HGB 12.7* 13.0 --  HCT 38.0* 38.6* --  MCV 87.2 87.1 --  PLT 306 301 --    Signed: Hedwig Mcfall M.D. 08/04/2011, 10:08 AM

## 2011-08-04 NOTE — Progress Notes (Signed)
ANTICOAGULATION CONSULT NOTE - Follow Up Consult  Pharmacy Consult for heparin to change to lovenox and new start coumadin Indication: DVT  No Known Allergies  Patient Measurements: Height: 6' (182.9 cm) Weight: 123 lb (55.792 kg) IBW/kg (Calculated) : 77.6   Vital Signs: Temp: 98.7 F (37.1 C) (12/24 0642) Temp src: Oral (12/24 0642) BP: 113/69 mmHg (12/24 0642) Pulse Rate: 80  (12/24 0642)  Labs:  Basename 08/04/11 0500 08/03/11 2246 08/03/11 1522 08/03/11 0615 08/02/11 0145 08/01/11 2032 08/01/11 2022  HGB 12.7* -- -- 13.0 -- -- --  HCT 38.0* -- -- 38.6* -- -- 43.5  PLT 306 -- -- 301 -- -- 309  APTT -- -- -- -- -- -- --  LABPROT -- -- -- -- -- 14.2 --  INR -- -- -- -- -- 1.08 --  HEPARINUNFRC 0.52 0.43 0.12* -- -- -- --  CREATININE 0.71 -- -- 0.68 0.66 -- --  CKTOTAL -- -- -- -- -- -- --  CKMB -- -- -- -- -- -- --  TROPONINI -- -- -- -- -- -- --   Estimated Creatinine Clearance: 116.3 ml/min (by C-G formula based on Cr of 0.71).  Assessment: 20yo male  therapeutic on heparin for dvt to transition to lovenox and coumadin for dc home w/ HH f/u. Goal of Therapy: INR 2-3  Plan:  lovenox 1.5mg /kg/24 hours = lovenox 80mg  q24 Coumadin 7.5mg  daily using 5mg  tablets.  To have INR checked 12/26.  Coumadin ed book and video. Herby Abraham PharmD  08/04/2011,9:30 AM

## 2011-08-04 NOTE — Progress Notes (Signed)
Patient's frequent vital signs are in chart. Patient's vital signs are stable. Patient ambulated 350 feet during the day shift and tolerated good. Patient's pulses are good and heard via doppler. Patient's incision site was assessed and bandaid applied per order. Will continue to monitor.

## 2011-08-04 NOTE — Progress Notes (Signed)
   CARE MANAGEMENT NOTE 08/04/2011  Patient:  Willie Charles, Willie Charles   Account Number:  [id number]  Date Initiated:  08/04/2011  Documentation initiated by:  Donn Pierini  Subjective/Objective Assessment:   Pt admitted with DVT     Action/Plan:   PTA pt lived independently, pt home from college for the holidays.   Anticipated DC Date:  08/04/2011   Anticipated DC Plan:  HOME/SELF CARE      DC Planning Services  CM consult  PCP issues      Choice offered to / List presented to:             Status of service:  Completed, signed off Medicare Important Message given?   (If response is "NO", the following Medicare IM given date fields will be blank) Date Medicare IM given:   Date Additional Medicare IM given:    Discharge Disposition:  HOME/SELF CARE  Per UR Regulation:  Reviewed for med. necessity/level of care/duration of stay  Comments:  08/04/11- 1200- Donn Pierini RN, BSN [phone] Spoke with pt and parents at bedside regarding need for PCP and lab follow up at discharge. Pt to be discharged on Lovenox 80 mg daily. Per conversation pt and parents do not have a local PCP and pt is home from a college in [place]. Plan is for parents to find a PCP in [place] when pt returns to school. In the meantime pt will need somewhere to have his PT/INR followed here- made calls to multiple MD offices and they are either closed today due to the holiday or can not take pt.- at this point the best option for pt may be to follow up at a [facility] or Urgent Care in town while he is at home. Call made to [facility] who stated that they could follow pt- they are a walk in only clinic. Spoke with pt and family about [facility] plan- they are ok with this plan, informed MD- regarding follow up with [facility]. Infor given to pt/parents on [facility] location in [place], and also [facility].

## 2011-08-04 NOTE — Progress Notes (Addendum)
Subjective: Patient doing well; awaiting d/c home  Objective: Vital signs in last 24 hours: Temp:  [98.7 F (37.1 C)] 98.7 F (37.1 C) (12/24 0642) Pulse Rate:  [80-93] 80  (12/24 0642) Resp:  [8-27] 19  (12/24 0642) BP: (113-141)/(58-89) 113/69 mmHg (12/24 0642) SpO2:  [93 %-100 %] 100 % (12/24 0642) FiO2 (%):  [100 %] 100 % (12/23 1310) Last BM Date: 08/01/11  Intake/Output from previous day: 12/23 0701 - 12/24 0700 In: 1434 [I.V.:1434] Out: -  Intake/Output this shift:   Left popliteal puncture site clean and dry, nontender; no significant left LE edema, intact distal pulses, motor/sens fxn intact  Lab Results:   Basename 08/04/11 0500 08/03/11 0615  WBC 5.7 7.1  HGB 12.7* 13.0  HCT 38.0* 38.6*  PLT 306 301   BMET  Basename 08/04/11 0500 08/03/11 0615  NA 135 135  K 4.1 4.2  CL 98 98  CO2 29 28  GLUCOSE 94 105*  BUN 9 8  CREATININE 0.71 0.68  CALCIUM 9.1 9.0   PT/INR  Basename 08/01/11 2032  LABPROT 14.2  INR 1.08   ABG No results found for this basename: PHART:2,PCO2:2,PO2:2,HCO3:2 in the last 72 hours  Studies/Results: Ir Veno/ext/uni Left  08/04/2011  *RADIOLOGY REPORT*  Clinical data:  Extensive occlusive left lower extremity DVT.  LEFT LOWER EXTREMITY VENOGRAM MECHANICAL THROMBECTOMY VENOUS ANGIOPLASTY VASCULAR STENT PLACEMENT ULTRASOUND GUIDANCE FOR VASCULAR ACCESS  Comparison:  none  Technique and findings: The procedure, risks (including but not limited to bleeding, infection, organ damage), benefits, and alternatives were explained to the patient and  parents.  Questions regarding the procedure were encouraged and answered.  They understand and consent to the procedure.  The patient placed prone.  The left popliteal region was prepped and draped in usual sterile fashion. Maximal barrier sterile technique was utilized including caps, mask, sterile gowns, sterile gloves, sterile drape, hand hygiene and skin antiseptic.  Intravenous Fentanyl and  Versed were administered as conscious sedation during continuous cardiorespiratory monitoring by the radiology RN, with a total moderate sedation time of 105 minutes.  Under real time ultrasound guidance after the skin was infiltrated with 1% lidocaine, the left short saphenous vein was accessed with a 21-gauge micropuncture needle, exchanged over a 0.018-inch guide wire for a transitional dilator.  Lower extremity venography was performed, demonstrating occlusive thrombus at the level of the left common femoral vein.  The dilator was exchanged over a Benson wire for a 6-French vascular sheath, through which a 5-French Kumpe catheter was advanced for additional venography, demonstrating occlusive thrombus extending through the left iliac venous system to its confluence with the IVC.  There is a high-grade stenosis at the confluence of the left common iliac vein with the IVC.  The catheter was exchanged for  the AngioJet catheter for mechanical thrombolysis of the iliofemoral DVT.  Follow-up venography showed partial resolution without significant improvement in antegrade straight line flow.  For this reason, 5 mg TPA were administered using pulse spray through the residual clot.  After 30 minutes, additional mechanical thrombectomy was performed.  There was partial clearance of thrombus.  Because of a high grade central occlusion, a 12 mm x 4 cm Atlas angioplasty balloon was advanced after the sheath was upsized to 7-French.  This was used to macerate residual clot in the iliac venous system and dilate the stenosis in the common iliac vein.  This improved the straight line flow.  There was still significant irregularity and narrowing at the confluence of the  IVC.  For this reason, a 14 mm x 60 mm Zilver stent was deployed across the lesion extending to the IVC.  This was dilated with the 12 mm balloon.  Final venography shows good antegrade flow through the iliofemoral system.  There is a small amount of residual  mural thrombus which is nonocclusive. Guide wire sheath were removed and hemostasis achieved at the site.  The patient tolerated the procedure well.    No immediate complication.  IMPRESSION 1.  Extensive occlusive left iliofemoral DVT. 2.  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 3.  Technically successful mechanical and pharmacologic thrombolysis of the left iliofemoral DVT. 4.  Technically successful stent assisted angioplasty of the left common iliac vein stenosis.  Recommend continuation of full anticoagulation to allow lysis of residual nonocclusive iliac thrombus, and lifelong aspirin therapy.  Original Report Authenticated By: Osa Craver, M.D.   Ir Pta Venous Left  08/04/2011  *RADIOLOGY REPORT*  Clinical data:  Extensive occlusive left lower extremity DVT.  LEFT LOWER EXTREMITY VENOGRAM MECHANICAL THROMBECTOMY VENOUS ANGIOPLASTY VASCULAR STENT PLACEMENT ULTRASOUND GUIDANCE FOR VASCULAR ACCESS  Comparison:  none  Technique and findings: The procedure, risks (including but not limited to bleeding, infection, organ damage), benefits, and alternatives were explained to the patient and  parents.  Questions regarding the procedure were encouraged and answered.  They understand and consent to the procedure.  The patient placed prone.  The left popliteal region was prepped and draped in usual sterile fashion. Maximal barrier sterile technique was utilized including caps, mask, sterile gowns, sterile gloves, sterile drape, hand hygiene and skin antiseptic.  Intravenous Fentanyl and Versed were administered as conscious sedation during continuous cardiorespiratory monitoring by the radiology RN, with a total moderate sedation time of 105 minutes.  Under real time ultrasound guidance after the skin was infiltrated with 1% lidocaine, the left short saphenous vein was accessed with a 21-gauge micropuncture needle, exchanged over  a 0.018-inch guide wire for a transitional dilator.  Lower extremity venography was performed, demonstrating occlusive thrombus at the level of the left common femoral vein.  The dilator was exchanged over a Benson wire for a 6-French vascular sheath, through which a 5-French Kumpe catheter was advanced for additional venography, demonstrating occlusive thrombus extending through the left iliac venous system to its confluence with the IVC.  There is a high-grade stenosis at the confluence of the left common iliac vein with the IVC.  The catheter was exchanged for  the AngioJet catheter for mechanical thrombolysis of the iliofemoral DVT.  Follow-up venography showed partial resolution without significant improvement in antegrade straight line flow.  For this reason, 5 mg TPA were administered using pulse spray through the residual clot.  After 30 minutes, additional mechanical thrombectomy was performed.  There was partial clearance of thrombus.  Because of a high grade central occlusion, a 12 mm x 4 cm Atlas angioplasty balloon was advanced after the sheath was upsized to 7-French.  This was used to macerate residual clot in the iliac venous system and dilate the stenosis in the common iliac vein.  This improved the straight line flow.  There was still significant irregularity and narrowing at the confluence of the IVC.  For this reason, a 14 mm x 60 mm Zilver stent was deployed across the lesion extending to the IVC.  This was dilated with the 12 mm balloon.  Final venography shows good antegrade flow through the  iliofemoral system.  There is a small amount of residual mural thrombus which is nonocclusive. Guide wire sheath were removed and hemostasis achieved at the site.  The patient tolerated the procedure well.    No immediate complication.  IMPRESSION 1.  Extensive occlusive left iliofemoral DVT. 2.  High-grade stenosis at the confluence of the left common iliac vein with the IVC consistent with May-Thurner  syndrome. 3.  Technically successful mechanical and pharmacologic thrombolysis of the left iliofemoral DVT. 4.  Technically successful stent assisted angioplasty of the left common iliac vein stenosis.  Recommend continuation of full anticoagulation to allow lysis of residual nonocclusive iliac thrombus, and lifelong aspirin therapy.  Original Report Authenticated By: Osa Craver, M.D.   Ir Thromboect Veno Mech  08/04/2011  *RADIOLOGY REPORT*  Clinical data:  Extensive occlusive left lower extremity DVT.  LEFT LOWER EXTREMITY VENOGRAM MECHANICAL THROMBECTOMY VENOUS ANGIOPLASTY VASCULAR STENT PLACEMENT ULTRASOUND GUIDANCE FOR VASCULAR ACCESS  Comparison:  none  Technique and findings: The procedure, risks (including but not limited to bleeding, infection, organ damage), benefits, and alternatives were explained to the patient and  parents.  Questions regarding the procedure were encouraged and answered.  They understand and consent to the procedure.  The patient placed prone.  The left popliteal region was prepped and draped in usual sterile fashion. Maximal barrier sterile technique was utilized including caps, mask, sterile gowns, sterile gloves, sterile drape, hand hygiene and skin antiseptic.  Intravenous Fentanyl and Versed were administered as conscious sedation during continuous cardiorespiratory monitoring by the radiology RN, with a total moderate sedation time of 105 minutes.  Under real time ultrasound guidance after the skin was infiltrated with 1% lidocaine, the left short saphenous vein was accessed with a 21-gauge micropuncture needle, exchanged over a 0.018-inch guide wire for a transitional dilator.  Lower extremity venography was performed, demonstrating occlusive thrombus at the level of the left common femoral vein.  The dilator was exchanged over a Benson wire for a 6-French vascular sheath, through which a 5-French Kumpe catheter was advanced for additional venography,  demonstrating occlusive thrombus extending through the left iliac venous system to its confluence with the IVC.  There is a high-grade stenosis at the confluence of the left common iliac vein with the IVC.  The catheter was exchanged for  the AngioJet catheter for mechanical thrombolysis of the iliofemoral DVT.  Follow-up venography showed partial resolution without significant improvement in antegrade straight line flow.  For this reason, 5 mg TPA were administered using pulse spray through the residual clot.  After 30 minutes, additional mechanical thrombectomy was performed.  There was partial clearance of thrombus.  Because of a high grade central occlusion, a 12 mm x 4 cm Atlas angioplasty balloon was advanced after the sheath was upsized to 7-French.  This was used to macerate residual clot in the iliac venous system and dilate the stenosis in the common iliac vein.  This improved the straight line flow.  There was still significant irregularity and narrowing at the confluence of the IVC.  For this reason, a 14 mm x 60 mm Zilver stent was deployed across the lesion extending to the IVC.  This was dilated with the 12 mm balloon.  Final venography shows good antegrade flow through the iliofemoral system.  There is a small amount of residual mural thrombus which is nonocclusive. Guide wire sheath were removed and hemostasis achieved at the site.  The patient tolerated the procedure well.    No immediate complication.  IMPRESSION 1.  Extensive occlusive left iliofemoral DVT. 2.  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 3.  Technically successful mechanical and pharmacologic thrombolysis of the left iliofemoral DVT. 4.  Technically successful stent assisted angioplasty of the left common iliac vein stenosis.  Recommend continuation of full anticoagulation to allow lysis of residual nonocclusive iliac thrombus, and lifelong aspirin  therapy.  Original Report Authenticated By: Osa Craver, M.D.   Ir US Guide Vasc Access Left  08/04/2011  *RADIOLOGY REPORT*  Clinical data:  Extensive occlusive left lower extremity DVT.  LEFT LOWER EXTREMITY VENOGRAM MECHANICAL THROMBECTOMY VENOUS ANGIOPLASTY VASCULAR STENT PLACEMENT ULTRASOUND GUIDANCE FOR VASCULAR ACCESS  Comparison:  none  Technique and findings: The procedure, risks (including but not limited to bleeding, infection, organ damage), benefits, and alternatives were explained to the patient and  parents.  Questions regarding the procedure were encouraged and answered.  They understand and consent to the procedure.  The patient placed prone.  The left popliteal region was prepped and draped in usual sterile fashion. Maximal barrier sterile technique was utilized including caps, mask, sterile gowns, sterile gloves, sterile drape, hand hygiene and skin antiseptic.  Intravenous Fentanyl and Versed were administered as conscious sedation during continuous cardiorespiratory monitoring by the radiology RN, with a total moderate sedation time of 105 minutes.  Under real time ultrasound guidance after the skin was infiltrated with 1% lidocaine, the left short saphenous vein was accessed with a 21-gauge micropuncture needle, exchanged over a 0.018-inch guide wire for a transitional dilator.  Lower extremity venography was performed, demonstrating occlusive thrombus at the level of the left common femoral vein.  The dilator was exchanged over a Benson wire for a 6-French vascular sheath, through which a 5-French Kumpe catheter was advanced for additional venography, demonstrating occlusive thrombus extending through the left iliac venous system to its confluence with the IVC.  There is a high-grade stenosis at the confluence of the left common iliac vein with the IVC.  The catheter was exchanged for  the AngioJet catheter for mechanical thrombolysis of the iliofemoral DVT.  Follow-up venography  showed partial resolution without significant improvement in antegrade straight line flow.  For this reason, 5 mg TPA were administered using pulse spray through the residual clot.  After 30 minutes, additional mechanical thrombectomy was performed.  There was partial clearance of thrombus.  Because of a high grade central occlusion, a 12 mm x 4 cm Atlas angioplasty balloon was advanced after the sheath was upsized to 7-French.  This was used to macerate residual clot in the iliac venous system and dilate the stenosis in the common iliac vein.  This improved the straight line flow.  There was still significant irregularity and narrowing at the confluence of the IVC.  For this reason, a 14 mm x 60 mm Zilver stent was deployed across the lesion extending to the IVC.  This was dilated with the 12 mm balloon.  Final venography shows good antegrade flow through the iliofemoral system.  There is a small amount of residual mural thrombus which is nonocclusive. Guide wire sheath were removed and hemostasis achieved at the site.  The patient tolerated the procedure well.    No immediate complication.  IMPRESSION 1.  Extensive occlusive left iliofemoral DVT. 2.  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 3.  Technically successful mechanical and pharmacologic thrombolysis of the  left iliofemoral DVT. 4.  Technically successful stent assisted angioplasty of the left common iliac vein stenosis.  Recommend continuation of full anticoagulation to allow lysis of residual nonocclusive iliac thrombus, and lifelong aspirin therapy.  Original Report Authenticated By: Osa Craver, M.D.    Anti-infectives: Anti-infectives    None      Assessment/Plan: s/p left CIV pta/stenting, left iliofemoral DVT thrombolysis 12/23; anticoagulation as per primary team.   LOS: 3 days    Suheyb Raucci,D [facility] 08/04/2011

## 2011-08-07 ENCOUNTER — Inpatient Hospital Stay (HOSPITAL_COMMUNITY)
Admission: EM | Admit: 2011-08-07 | Discharge: 2011-08-17 | DRG: 479 | Disposition: A | Discharge status: Home / Self Care | Payer: BC Managed Care – PPO | Attending: Internal Medicine | Admitting: Internal Medicine

## 2011-08-07 ENCOUNTER — Emergency Department (HOSPITAL_COMMUNITY): Payer: BC Managed Care – PPO

## 2011-08-07 ENCOUNTER — Encounter (HOSPITAL_COMMUNITY): Payer: Self-pay | Admitting: *Deleted

## 2011-08-07 DIAGNOSIS — M79609 Pain in unspecified limb: Secondary | ICD-10-CM

## 2011-08-07 DIAGNOSIS — I82409 Acute embolism and thrombosis of unspecified deep veins of unspecified lower extremity: Secondary | ICD-10-CM

## 2011-08-07 DIAGNOSIS — R509 Fever, unspecified: Secondary | ICD-10-CM | POA: Diagnosis not present

## 2011-08-07 DIAGNOSIS — T45515A Adverse effect of anticoagulants, initial encounter: Secondary | ICD-10-CM | POA: Diagnosis present

## 2011-08-07 DIAGNOSIS — I749 Embolism and thrombosis of unspecified artery: Secondary | ICD-10-CM

## 2011-08-07 DIAGNOSIS — I824Y9 Acute embolism and thrombosis of unspecified deep veins of unspecified proximal lower extremity: Principal | ICD-10-CM | POA: Diagnosis present

## 2011-08-07 DIAGNOSIS — Z7901 Long term (current) use of anticoagulants: Secondary | ICD-10-CM

## 2011-08-07 DIAGNOSIS — L0390 Cellulitis, unspecified: Secondary | ICD-10-CM

## 2011-08-07 DIAGNOSIS — R791 Abnormal coagulation profile: Secondary | ICD-10-CM | POA: Diagnosis present

## 2011-08-07 DIAGNOSIS — R52 Pain, unspecified: Secondary | ICD-10-CM

## 2011-08-07 DIAGNOSIS — D689 Coagulation defect, unspecified: Secondary | ICD-10-CM

## 2011-08-07 DIAGNOSIS — I82402 Acute embolism and thrombosis of unspecified deep veins of left lower extremity: Secondary | ICD-10-CM | POA: Diagnosis present

## 2011-08-07 DIAGNOSIS — Z7982 Long term (current) use of aspirin: Secondary | ICD-10-CM

## 2011-08-07 HISTORY — DX: Acute embolism and thrombosis of unspecified deep veins of unspecified lower extremity: I82.409

## 2011-08-07 LAB — BASIC METABOLIC PANEL
CO2: 27 mEq/L (ref 19–32)
Calcium: 9.5 mg/dL (ref 8.4–10.5)
Chloride: 94 mEq/L — ABNORMAL LOW (ref 96–112)
Glucose, Bld: 99 mg/dL (ref 70–99)
Sodium: 134 mEq/L — ABNORMAL LOW (ref 135–145)

## 2011-08-07 LAB — CBC
HCT: 39.5 % (ref 39.0–52.0)
MCV: 85.3 fL (ref 78.0–100.0)
Platelets: 437 10*3/uL — ABNORMAL HIGH (ref 150–400)
RBC: 4.63 MIL/uL (ref 4.22–5.81)
RDW: 12.3 % (ref 11.5–15.5)
WBC: 8.9 10*3/uL (ref 4.0–10.5)

## 2011-08-07 LAB — DIFFERENTIAL
Basophils Absolute: 0 10*3/uL (ref 0.0–0.1)
Eosinophils Relative: 1 % (ref 0–5)
Lymphocytes Relative: 13 % (ref 12–46)
Lymphs Abs: 1.2 10*3/uL (ref 0.7–4.0)
Neutro Abs: 6.6 10*3/uL (ref 1.7–7.7)

## 2011-08-07 LAB — APTT: aPTT: 61 seconds — ABNORMAL HIGH (ref 24–37)

## 2011-08-07 LAB — FIBRINOGEN
Fibrinogen: 598 mg/dL — ABNORMAL HIGH (ref 204–475)
Fibrinogen: 529 mg/dL — ABNORMAL HIGH (ref 204–475)

## 2011-08-07 LAB — PROTIME-INR: INR: 5.82 (ref 0.00–1.49)

## 2011-08-07 LAB — FACTOR 5 LEIDEN

## 2011-08-07 MED ORDER — IOHEXOL 300 MG/ML  SOLN
100.0000 mL | Freq: Once | INTRAMUSCULAR | Status: AC | PRN
  Administered 2011-08-07: 50 mL via INTRAVENOUS

## 2011-08-07 MED ORDER — HEPARIN SOD (PORCINE) IN D5W 100 UNIT/ML IV SOLN
800.0000 [IU]/h | INTRAVENOUS | Status: DC
  Administered 2011-08-07: 800 [IU]/h via INTRAVENOUS
  Filled 2011-08-07: qty 250

## 2011-08-07 MED ORDER — SODIUM CHLORIDE 0.9 % IJ SOLN: 30.0000 mL | INTRAMUSCULAR | Status: DC

## 2011-08-07 MED ORDER — ACETAMINOPHEN 325 MG PO TABS
650.0000 mg | ORAL_TABLET | Freq: Four times a day (QID) | ORAL | Status: DC | PRN
  Administered 2011-08-07 – 2011-08-10 (×5): 650 mg via ORAL
  Filled 2011-08-07 (×5): qty 2

## 2011-08-07 MED ORDER — ONDANSETRON HCL 4 MG/2ML IJ SOLN
4.0000 mg | Freq: Once | INTRAMUSCULAR | Status: AC
  Administered 2011-08-07: 4 mg via INTRAVENOUS
  Filled 2011-08-07: qty 2

## 2011-08-07 MED ORDER — MIDAZOLAM HCL 5 MG/5ML IJ SOLN
INTRAMUSCULAR | Status: AC | PRN
  Administered 2011-08-07 (×2): 1 mg via INTRAVENOUS

## 2011-08-07 MED ORDER — TENECTEPLASE 50 MG IV KIT
20.0000 mg/h | PACK | INTRAVENOUS | Status: AC
  Administered 2011-08-07: 20 mg/h via INTRAVENOUS
  Filled 2011-08-07: qty 1

## 2011-08-07 MED ORDER — MORPHINE SULFATE 4 MG/ML IJ SOLN
4.0000 mg | Freq: Once | INTRAMUSCULAR | Status: AC
  Administered 2011-08-07: 4 mg via INTRAVENOUS
  Filled 2011-08-07: qty 1

## 2011-08-07 MED ORDER — HEPARIN SOD (PORCINE) IN D5W 100 UNIT/ML IV SOLN
800.0000 [IU]/h | INTRAVENOUS | Status: DC
Start: 2011-08-07 — End: 2011-08-07
  Filled 2011-08-07: qty 250

## 2011-08-07 MED ORDER — SODIUM CHLORIDE 0.9 % IV SOLN
INTRAVENOUS | Status: DC
  Administered 2011-08-07 – 2011-08-10 (×6): via INTRAVENOUS
  Administered 2011-08-11: 125 mL/h via INTRAVENOUS
  Administered 2011-08-11: 125 mL via INTRAVENOUS
  Administered 2011-08-12 – 2011-08-16 (×10): via INTRAVENOUS

## 2011-08-07 MED ORDER — TENECTEPLASE 50 MG IV KIT
0.2000 mg/h | PACK | INTRAVENOUS | Status: DC
  Filled 2011-08-07: qty 0.5

## 2011-08-07 MED ORDER — MORPHINE SULFATE 2 MG/ML IJ SOLN
INTRAMUSCULAR | Status: AC
  Filled 2011-08-07: qty 3

## 2011-08-07 MED ORDER — ONDANSETRON HCL 4 MG/2ML IJ SOLN: 4.0000 mg | Freq: Four times a day (QID) | INTRAMUSCULAR | Status: DC | PRN

## 2011-08-07 MED ORDER — MIDAZOLAM HCL 2 MG/2ML IJ SOLN: 1.0000 mg | INTRAMUSCULAR | Status: DC | PRN

## 2011-08-07 MED ORDER — HEPARIN SOD (PORCINE) IN D5W 100 UNIT/ML IV SOLN
1850.0000 [IU]/h | INTRAVENOUS | Status: DC
  Administered 2011-08-07: 1850 [IU]/h via INTRAVENOUS
  Filled 2011-08-07 (×2): qty 250

## 2011-08-07 MED ORDER — SODIUM CHLORIDE 0.9 % IJ SOLN: 3.0000 mL | INTRAMUSCULAR | Status: DC | PRN

## 2011-08-07 MED ORDER — FENTANYL CITRATE 0.05 MG/ML IJ SOLN
INTRAMUSCULAR | Status: AC | PRN
  Administered 2011-08-07: 50 ug via INTRAVENOUS
  Administered 2011-08-07: 25 ug via INTRAVENOUS

## 2011-08-07 MED ORDER — SODIUM CHLORIDE 0.9 % IV SOLN: 250.0000 mL | INTRAVENOUS | Status: DC | PRN

## 2011-08-07 MED ORDER — MORPHINE SULFATE 2 MG/ML IJ SOLN
5.0000 mg | INTRAMUSCULAR | Status: DC | PRN
  Administered 2011-08-07: 4 mg via INTRAVENOUS
  Administered 2011-08-07 – 2011-08-08 (×7): 5 mg via INTRAVENOUS
  Filled 2011-08-07 (×2): qty 3
  Filled 2011-08-07: qty 2
  Filled 2011-08-07 (×4): qty 3

## 2011-08-07 NOTE — ED Provider Notes (Signed)
Patient seen by vascular PA--will proceed directly to interventional radiology for procedure, and then admitted.  Jimmye Norman, NP 08/07/11 [phone]

## 2011-08-07 NOTE — Consult Note (Signed)
Name: Willie Charles MRN: [id number] DOB: October 25, 1990    LOS: 0  PCCM CONSULTATION NOTE  History of Present Illness: 20 yo WM with recurrent LLE DVT despite Coumadin, Lovenox and ASA, s/p catheter directed clot lysis.  Lines / Drains: 12/27  LLE venous sheath  Cultures: None  Antibiotics: None  Tests / Events: 12/27  Catheter directed lysis of the recurrent iliofemoral DVT  Past Medical History  Diagnosis Date  . No pertinent past medical history   . DVT (deep venous thrombosis)    Past Surgical History  Procedure Date  . Mouth surgery   . No past surgeries    Prior to Admission medications   Medication Sig Start Date End Date Taking? Authorizing Provider  aspirin 81 MG chewable tablet Chew 1 tablet (81 mg total) by mouth daily. 08/04/11 08/03/12 Yes Belkys Regalado, MD  cyclobenzaprine (FLEXERIL) 5 MG tablet Take 1 tablet (5 mg total) by mouth 3 (three) times daily as needed for muscle spasms. 08/04/11  Yes Belkys Regalado, MD  enoxaparin (LOVENOX) 80 MG/0.8ML SOLN injection Inject 0.8 mLs (80 mg total) into the skin daily. 08/04/11  Yes Hartley Barefoot, MD  PRESCRIPTION MEDICATION Take 1 tablet by mouth daily as needed. For pain Medication is a prescribed pain medication.    Yes Historical Provider, MD  warfarin (COUMADIN) 5 MG tablet Take 5 mg by mouth daily at 6 PM.   08/04/11 08/03/12 Yes Hartley Barefoot, MD   Allergies No Known Allergies  Family History History reviewed. No pertinent family history.  Social History  reports that he has never smoked. He has never used smokeless tobacco. He reports that he drinks alcohol. He reports that he does not use illicit drugs.  Review Of Systems  11 points review of systems is negative with an exception of listed in HPI.  Vital Signs: Temp:  [97.8 F (36.6 C)-102.9 F (39.4 C)] 100.8 F (38.2 C) (12/27 2200) Pulse Rate:  [77-108] 77  (12/27 2300) Resp:  [10-20] 12  (12/27 2300) BP: (105-122)/(54-80) 105/54 mmHg  (12/27 2300) SpO2:  [94 %-100 %] 95 % (12/27 2300) Weight:  [57.607 kg (127 lb)] 127 lb (57.607 kg) (12/27 1735) I/O last 3 completed shifts: In: 625.8 [I.V.:585.8; IV Piggyback:40] Out: -   Physical Examination: General:  No distress Neuro:  Awake, alert, cooperative   HEENT:  PERRL, NCAT Neck:  Supple, no JVD   Cardiovascular:  RRR, no murmurs Lungs:  CTAB Abdomen:  Soft, nontender Musculoskeletal:  LLE tenderness Skin:  Intact  Ventilator settings:   Labs and Imaging:  Reviewed.  Please refer to the Assessment and Plan section for relevant results.  Assessment and Plan:  Recurrent LLE DVT, s/p catheter directed thrombolysis -->  Per interventional radiology -->  Monitor for bleeding  Fever, source is not clear, ? post procedure -->  Tylenol -->  Monitor for now  Best practices / Disposition -->  ICU status under Interventional Radiology -->  PCCM consulting -->  full code -->  DVT Px is not indicated (actively treated) -->  GI Px is not indicated  Orlean Bradford, M.D. Pulmonary and Critical Care Medicine [facility] Cell: [phone] Pager: [phone]  08/07/2011, 11:27 PM

## 2011-08-07 NOTE — ED Notes (Addendum)
Pt reports he has surgery on Sunday to remove blood clot from left thigh. Pt states today he is having pain to entire left leg. Pt reports fatigue. Denies sob. Denies swelling. Denies any numbness or tingling to lower extremity.

## 2011-08-07 NOTE — ED Provider Notes (Signed)
History     CSN: [id number]  Arrival date & time 08/07/11  1244   First MD Initiated Contact with Patient 08/07/11 1416      Chief Complaint  Patient presents with  . Leg Pain    (Consider location/radiation/quality/duration/timing/severity/associated sxs/prior treatment) Patient is a 20 y.o. male presenting with leg pain. The history is provided by the patient and medical records.  Leg Pain    the patient is a 20 year old, male, who presents to emergency department complaining of pain in his left leg from his thigh to his foot.  He also has mild swelling.  He states that he had a clot removed from his femoral vein 4 days ago.  He was discharged 3 days ago.  He has not had trauma.  He has not had a fever.  He says the pain is too severe to walk.  He is taking Coumadin.  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  He specifically denies chest pain or shortness of breath.  Past Medical History  Diagnosis Date  . No pertinent past medical history   . DVT (deep venous thrombosis)     Past Surgical History  Procedure Date  . Mouth surgery   . No past surgeries     History reviewed. No pertinent family history.  History  Substance Use Topics  . Smoking status: Never Smoker   . Smokeless tobacco: Never Used  . Alcohol Use: Yes     occasional      Review of Systems  Constitutional: Negative for fever and chills.  Respiratory: Negative for cough and shortness of breath.   Cardiovascular: Negative for chest pain.  Gastrointestinal: Negative for abdominal distention.  Musculoskeletal: Negative for back pain.       Left leg pain and swelling  Skin: Negative for rash.  Neurological: Negative for headaches.  Hematological: Does not bruise/bleed easily.  Psychiatric/Behavioral: Negative for confusion.    Allergies  Review of patient's allergies indicates no known allergies.  Home Medications   Current Outpatient Rx  Name Route Sig Dispense Refill    . ASPIRIN 81 MG PO CHEW Oral Chew 1 tablet (81 mg total) by mouth daily. 30 tablet 0  . CYCLOBENZAPRINE HCL 5 MG PO TABS Oral Take 1 tablet (5 mg total) by mouth 3 (three) times daily as needed for muscle spasms. 30 tablet 0  . ENOXAPARIN SODIUM 80 MG/0.8ML [place] SOLN Subcutaneous Inject 0.8 mLs (80 mg total) into the skin daily. 7 Syringe 0  . PRESCRIPTION MEDICATION Oral Take 1 tablet by mouth daily as needed. For pain Medication is a prescribed pain medication.     . WARFARIN SODIUM 5 MG PO TABS Oral Take 5 mg by mouth daily at 6 PM.        BP 107/69  Pulse 108  Temp(Src) 97.8 F (36.6 C) (Oral)  Resp 18  SpO2 100%  Physical Exam  Constitutional: He is oriented to person, place, and time. He appears well-developed and well-nourished. No distress.  HENT:  Head: Normocephalic and atraumatic.  Eyes: EOM are normal. Pupils are equal, round, and reactive to light.  Neck: Normal range of motion. Neck supple.  Cardiovascular: Normal rate, regular rhythm and normal heart sounds.   No murmur heard. Pulmonary/Chest: Effort normal and breath sounds normal. No respiratory distress. He has no wheezes. He has no rales.  Abdominal: He exhibits no distension.  Musculoskeletal: Normal range of motion. He exhibits tenderness. He exhibits no edema.  Mild tenderness in left lower extremity from the thigh to proximal calf.  The patient is very thin, and I did not perceive any swelling.  Neurological: He is alert and oriented to person, place, and time. No cranial nerve deficit.  Skin: Skin is warm and dry. He is not diaphoretic.  Psychiatric: He has a normal mood and affect. His behavior is normal.    ED Course  Procedures (including critical care time) 20 year old male, with recent thrombectomy from his left femoral vein.  Presents with increased pain in his left lower extremity.  He is on Coumadin.  We will check an INR, but also perform a duplex ultrasound of his left leg to determine whether  or not.  He has had recurrent clot.  Labs Reviewed  PROTIME-INR - Abnormal; Notable for the following:    Prothrombin Time 53.1 (*)    INR 5.82 (*)    All other components within normal limits  CBC - Abnormal; Notable for the following:    Platelets 437 (*)    All other components within normal limits  DIFFERENTIAL - Abnormal; Notable for the following:    Monocytes Absolute 1.1 (*)    All other components within normal limits  BASIC METABOLIC PANEL   No results found.   1. Left leg DVT    3:43 PM I spoke with Dr. Grace Isaac and gave him the preliminary report that there is recurrent clot in the left femoral vein. He will review the duplex ultrasound test result.  And he will call me back concerning further treatment.    MDM  DVT        Nicholes Stairs, MD 08/07/11 2004

## 2011-08-07 NOTE — ED Notes (Signed)
2115-01 READY

## 2011-08-07 NOTE — Progress Notes (Signed)
1735:  Pt arrived to 2100 by bed from IR with 2 RNs.

## 2011-08-07 NOTE — ED Notes (Signed)
Returned from ultrasound.

## 2011-08-07 NOTE — Progress Notes (Signed)
ANTICOAGULATION CONSULT NOTE - Initial Consult  Pharmacy Consult:  Heparin Indication:  Recurrent LLE DVT  No Known Allergies  Patient Measurements: Heparin weight = 56 kg  Vital Signs: Temp: 99.7 F (37.6 C) (12/27 1608) Temp src: Oral (12/27 1608) BP: 115/60 mmHg (12/27 1656) Pulse Rate: 81  (12/27 1656)  Labs:  Basename 08/07/11 1609 08/07/11 1441 08/07/11 1311  HGB -- 13.6 --  HCT -- 39.5 --  PLT -- 437* --  APTT 61* -- --  LABPROT -- -- 53.1*  INR -- -- 5.82*  HEPARINUNFRC -- -- --  CREATININE -- 0.72 --  CKTOTAL -- -- --  CKMB -- -- --  TROPONINI -- -- --   The CrCl is unknown because both a height and weight (above a minimum accepted value) are required for this calculation.  Medical History: Past Medical History  Diagnosis Date  . No pertinent past medical history   . DVT (deep venous thrombosis)     Assessment: 20 YOM discharged from [facility] last week post lysis of LE DVT and stent placement.  Patient presented to the ED today with Doppler revealing a left leg DVT despite being on Coumadin, Lovenox, and ASA.  Surprisingly, the patient's INR at admission was elevated at 5.82.  Patient is now s/p lysis of DVT and is currently on TNKase infusion.  Patient was started on heparin gtt at 800 units/hr post procedure.  During the previous admission, pt was on heparin at 1950 units/hr for HL of 0.43 and 0.52 units/mL.  Goal of Therapy: HL 0.2 - 0.5 per MD   Plan:  - Increase heparin gtt to 1850 units/hr, no bolus - Heparin level 6 hours post rate change - Daily HL and CBC   Willie Charles, Willie Charles 08/07/2011,6:02 PM

## 2011-08-07 NOTE — ED Provider Notes (Signed)
Medical screening examination/treatment/procedure(s) were conducted as a shared visit with non-physician practitioner(s) and myself.  I personally evaluated the patient during the encounter  Nicholes Stairs, MD 08/07/11 2004

## 2011-08-07 NOTE — H&P (Signed)
Willie Charles is an 20 y.o. male.   Chief Complaint: Recurrent left lower extremity DVT. HPI: Pleasant 20 yo male recently admitted to [facility]11 with a left lower extremity DVT. Interventional Radiology was consulted and the patient underwent tpa and stent placement by Dr Rica Records on Sunday 12/23. The patient was discharged with improved symptoms; however, his pain returned and the patient's mother accompanied him to the [facility] ED today where another doppler exam confirmed a recurrent DVT despite being on coumadin, Lovenox, and low dose aspirin. His INR today is 5.82. The pt has been seen by Dr Grace Isaac and the plan is for a venogram with possible more extensive lysis, and possible further stent placement. We will contact critical care to arrange admission to the intensive care unit.  Past Medical History  Diagnosis Date  . No pertinent past medical history   . DVT (deep venous thrombosis)     Past Surgical History  Procedure Date  . Mouth surgery   . No past surgeries     History reviewed. No pertinent family history. Social History:  reports that he has never smoked. He has never used smokeless tobacco. He reports that he drinks alcohol. He reports that he does not use illicit drugs.  Allergies: No Known Allergies  Medications Prior to Admission  Medication Dose Route Frequency Provider Last Rate Last Dose  . 0.9 %  sodium chloride infusion   Intravenous Continuous Nicholes Stairs, MD 125 mL/hr at 08/07/11 1457    . morphine 4 MG/ML injection 4 mg  4 mg Intravenous Once Nicholes Stairs, MD   4 mg at 08/07/11 1457  . ondansetron (ZOFRAN) injection 4 mg  4 mg Intravenous Once Nicholes Stairs, MD   4 mg at 08/07/11 1457   Medications Prior to Admission  Medication Sig Dispense Refill  . aspirin 81 MG chewable tablet Chew 1 tablet (81 mg total) by mouth daily.  30 tablet  0  . cyclobenzaprine (FLEXERIL) 5 MG tablet Take 1 tablet (5 mg total) by mouth 3  (three) times daily as needed for muscle spasms.  30 tablet  0  . enoxaparin (LOVENOX) 80 MG/0.8ML SOLN injection Inject 0.8 mLs (80 mg total) into the skin daily.  7 Syringe  0  . warfarin (COUMADIN) 5 MG tablet Take 5 mg by mouth daily at 6 PM.          Results for orders placed during the hospital encounter of 08/07/11 (from the past 48 hour(s))  PROTIME-INR     Status: Abnormal   Collection Time   08/07/11  1:11 PM      Component Value Range Comment   Prothrombin Time 53.1 (*) 11.6 - 15.2 (seconds)    INR 5.82 (*) 0.00 - 1.49    CBC     Status: Abnormal   Collection Time   08/07/11  2:41 PM      Component Value Range Comment   WBC 8.9  4.0 - 10.5 (K/uL)    RBC 4.63  4.22 - 5.81 (MIL/uL)    Hemoglobin 13.6  13.0 - 17.0 (g/dL)    HCT 78.4  69.6 - 29.5 (%)    MCV 85.3  78.0 - 100.0 (fL)    MCH 29.4  26.0 - 34.0 (pg)    MCHC 34.4  30.0 - 36.0 (g/dL)    RDW 28.4  13.2 - 44.0 (%)    Platelets 437 (*) 150 - 400 (K/uL)   DIFFERENTIAL  Status: Abnormal   Collection Time   08/07/11  2:41 PM      Component Value Range Comment   Neutrophils Relative 74  43 - 77 (%)    Neutro Abs 6.6  1.7 - 7.7 (K/uL)    Lymphocytes Relative 13  12 - 46 (%)    Lymphs Abs 1.2  0.7 - 4.0 (K/uL)    Monocytes Relative 12  3 - 12 (%)    Monocytes Absolute 1.1 (*) 0.1 - 1.0 (K/uL)    Eosinophils Relative 1  0 - 5 (%)    Eosinophils Absolute 0.1  0.0 - 0.7 (K/uL)    Basophils Relative 0  0 - 1 (%)    Basophils Absolute 0.0  0.0 - 0.1 (K/uL)   BASIC METABOLIC PANEL     Status: Abnormal   Collection Time   08/07/11  2:41 PM      Component Value Range Comment   Sodium 134 (*) 135 - 145 (mEq/L)    Potassium 4.5  3.5 - 5.1 (mEq/L)    Chloride 94 (*) 96 - 112 (mEq/L)    CO2 27  19 - 32 (mEq/L)    Glucose, Bld 99  70 - 99 (mg/dL)    BUN 12  6 - 23 (mg/dL)    Creatinine, Ser 1.61  0.50 - 1.35 (mg/dL)    Calcium 9.5  8.4 - 10.5 (mg/dL)    GFR calc non Af Amer >90  >90 (mL/min)    GFR calc Af Amer >90   >90 (mL/min)    No results found.  Review of Systems  Unable to perform ROS: other    LLE pain - minimal swelling.  Blood pressure 116/72, pulse 86, temperature 99.7 F (37.6 C), temperature source Oral, resp. rate 20, SpO2 100.00%. Physical Exam  No significant edema of lower extremities.   Assessment/Plan Lysis for recurrent LLE DVT with subsequent admission to the intensive care unit by critical care. Informed consent was obtained. Further exam and ROS was not performed as the patient was in the procedure.  Willie Charles 08/07/2011, 4:23 PM

## 2011-08-07 NOTE — ED Notes (Signed)
Patient transported to Ultrasound 

## 2011-08-07 NOTE — ED Notes (Signed)
Pt had left lower extremity dvt lysis performed by dr. Deanne Coffer last week. D/c'd home w/o complication. Pt presents today with increased pain in left thigh and calf. Also reports increased fatigue. Denies sob/cp. Pedal pulses and rom intact. Denies numbness/tingling. No swelling or edema noted.

## 2011-08-07 NOTE — Progress Notes (Signed)
Bilateral lower extremity venous duplex completed.  Preliminary report is negative for DVT in the right leg.  There appears to be DVT in the left common femoral, femoral, profunda, popliteal, posterior tibial, and peroneal veins with superficial thrombus noted in the left greater saphenous vein. Smiley Houseman 08/07/2011, 3:26 PM

## 2011-08-07 NOTE — Significant Event (Signed)
Pt noted to have temp 102F.  No obvious distress and hemodynamics stable.  Will give tylenol and monitor fever curve.  Ilia Engelbert, MD 08/07/2011, 8:50 PM [phone]

## 2011-08-07 NOTE — ED Notes (Signed)
TO INTERVENTIONAL RADIOLOGY FOR EMERGENT PROCEDURE THEN WILL BE ADMITTED TO ICU

## 2011-08-07 NOTE — Procedures (Signed)
Successful initiation of left lower extremity venous lysis for recurrent iliofemoral DVT.  Infusion catheter extends from end of vascular sheath to level of IVC.  Patient tolerated procedure without immediate post procedural complication.   Will be observed overnight in ICU for TNKase administration and will return tomorrow AM for repeat venogram and possible intervention.

## 2011-08-08 ENCOUNTER — Inpatient Hospital Stay (HOSPITAL_COMMUNITY): Payer: BC Managed Care – PPO

## 2011-08-08 LAB — CBC
MCH: 28.7 pg (ref 26.0–34.0)
Platelets: 341 10*3/uL (ref 150–400)
RBC: 4.29 MIL/uL (ref 4.22–5.81)
RDW: 12.5 % (ref 11.5–15.5)

## 2011-08-08 LAB — PROTIME-INR
INR: 5.87 (ref 0.00–1.49)
Prothrombin Time: 53.4 seconds — ABNORMAL HIGH (ref 11.6–15.2)

## 2011-08-08 LAB — CREATININE, SERUM: GFR calc Af Amer: >90 mL/min (ref >90)

## 2011-08-08 MED ORDER — CYCLOBENZAPRINE HCL 10 MG PO TABS
5.0000 mg | ORAL_TABLET | Freq: Three times a day (TID) | ORAL | Status: DC | PRN
  Filled 2011-08-08: qty 1

## 2011-08-08 MED ORDER — HEPARIN SOD (PORCINE) IN D5W 100 UNIT/ML IV SOLN
1650.0000 [IU]/h | INTRAVENOUS | Status: DC
  Filled 2011-08-08: qty 250

## 2011-08-08 MED ORDER — IOHEXOL 300 MG/ML  SOLN
100.0000 mL | Freq: Once | INTRAMUSCULAR | Status: AC | PRN
  Administered 2011-08-08: 25 mL via INTRAVENOUS

## 2011-08-08 NOTE — Progress Notes (Signed)
CRITICAL VALUE ALERT  Critical value received:  INR 5.87  Date of notification:  08/08/11  Time of notification:  1000  Critical value read back:yes  Nurse who received alert:  Mirian Capuchin, RN  MD notified (1st page):  Delton Coombes, MD  Time of first page:  1000  MD notified (2nd page):  Time of second page:  Responding MD:  Delton Coombes, MD  Time MD responded:  1000

## 2011-08-08 NOTE — Progress Notes (Signed)
ANTICOAGULATION CONSULT NOTE - Initial Consult  Pharmacy Consult:  Heparin Indication:  Recurrent LLE DVT  No Known Allergies  Patient Measurements: Heparin weight = 56 kg  Vital Signs: Temp: 98.3 F (36.8 C) (12/28 0016) Temp src: Oral (12/28 0016) BP: 118/68 mmHg (12/28 0200) Pulse Rate: 155  (12/28 0200)  Labs:  Basename 08/08/11 0125 08/07/11 1609 08/07/11 1441 08/07/11 1311  HGB -- -- 13.6 --  HCT -- -- 39.5 --  PLT -- -- 437* --  APTT -- 61* -- --  LABPROT -- -- -- 53.1*  INR -- -- -- 5.82*  HEPARINUNFRC 0.81* -- -- --  CREATININE -- -- 0.72 --  CKTOTAL -- -- -- --  CKMB -- -- -- --  TROPONINI -- -- -- --   Estimated Creatinine Clearance: 120 ml/min (by C-G formula based on Cr of 0.72).  Medical History: Past Medical History  Diagnosis Date  . No pertinent past medical history   . DVT (deep venous thrombosis)     Assessment: 20 YOM with DVT s/p lysis, currently on TNKase infusion, for Heparin Goal of Therapy: HL 0.2 - 0.5 per MD   Plan:  Hold Heparin x 1 hr, then decrease Heparin 1650 units/hr F/U after venogram this AM.  Lashika Erker, Gary Fleet 08/08/2011,2:51 AM

## 2011-08-08 NOTE — Progress Notes (Signed)
Patient unable to void, states he feels the urge however is unable to urinate. Patient currently complaining of bladder discomfort. Bladder scan performed by Jamesetta So and myself which noted of urine present in bladder. Patient educated on the indication of placing a urinary catheter due to his inablilty to void(verbalized understanding).  11F foley cath placed without difficulty. Immediate dark yellow urine return noted.

## 2011-08-08 NOTE — Progress Notes (Signed)
Subjective: Still has left lower extremity pain.  No significant change.  No problems overnight except elevated temperatures.  Objective: Vital signs in last 24 hours: Temp:  [97.8 F (36.6 C)-102.9 F (39.4 C)] 99.4 F (37.4 C) (12/28 0845) Pulse Rate:  [68-155] 77  (12/28 0800) Resp:  [10-24] 12  (12/28 0800) BP: (96-124)/(53-80) 100/55 mmHg (12/28 0800) SpO2:  [93 %-100 %] 96 % (12/28 0800) Weight:  [127 lb (57.607 kg)] 127 lb (57.607 kg) (12/27 1735)    Intake/Output from previous day: 12/27 0701 - 12/28 0700 In: 3401.3 [P.O.:480; I.V.:2641.3; IV Piggyback:280] Out: 1350 [Urine:1350] Intake/Output this shift:    PE:  Moderate swelling in left thigh and mild swelling in left calf and left ankle.  Palplable and Dopplerable pulses in left ankle.  No bleeding.  Catheter intact.    Lab Results:   [facility] 08/08/11 0640 08/07/11 1441  WBC 8.7 8.9  HGB 12.3* 13.6  HCT 36.5* 39.5  PLT 341 437*   BMET  Basename 08/08/11 0640 08/07/11 1441  NA -- 134*  K 4.7 4.5  CL -- 94*  CO2 -- 27  GLUCOSE -- 99  BUN -- 12  CREATININE 0.70 0.72  CALCIUM -- 9.5   PT/INR  Basename 08/07/11 1311  LABPROT 53.1*  INR 5.82*   ABG No results found for this basename: PHART:2,PCO2:2,PO2:2,HCO3:2 in the last 72 hours  Studies/Results: Ir Veno/ext/uni Left  08/07/2011  *RADIOLOGY REPORT*  Indication: Recurrent left lower extremity DVT  LEFT LOWER EXTERMITY VENOGRAM AND PLACEMENT OF INFUSION CATHETER ULTRASOUND GUIDANCE FOR VASCULAR ACCESS  Comparisons: Left lower extremity venous lysis, angioplasty and stent placement - 08/03/2011  Intravenous Medications: Fentanyl 75 mcg IV; Versed 2 mg IV  Contrast: 50 ml Omnipaque-300  Total Moderate Sedation Time: 18 minutes  Fluoroscopy Time: 4.7 minutes.  Complications: None immediate  Technique / Findings:  Informed written consent was obtained from the patient and the patient's mother after a discussion of the risks, benefits and alternatives to  treatment.  Questions regarding the procedure were encouraged and answered.  A timeout was performed prior to the initiation of the procedure.  The patient was placed prone on the fluoroscopy table and the skin posterior to the left knee was prepped and draped in the usual sterile fashion, and a sterile drape was applied covering the operative field.  Maximum barrier sterile technique with sterile gowns and gloves were used for the procedure.  Local anesthesia was provided with 1% lidocaine.  Ultrasound scanning demonstrates the popliteal vein is enlarged, filled with echogenic material and noncompressible, compatible with complete occlusive thrombus.  The left above knee popliteal vein was accessed with a micropuncture needle under direct ultrasound guidance.  This allowed for placement of a 6-French vascular sheath. The limited venogram was performed through the side arm of the sheath demonstrating complete occlusion of the popliteal vein.  With the use of a regular glide wire, a Kumpe catheter was advanced through the left femoral, external iliac vein and through the left common iliac vein to the level of the IVC.  Limited venograms were performed in multiple stations confirming occlusive thrombus throughout the left lower extremity extending from the left popliteal to the common iliac vein stent.  An inferior venacavagram was performed demonstrating wide patency of the IVC.  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  A wire was utilized for measuring purposes and over an Amplatz super stiff wire, a 90 cm/50-cm infusion catheter was advanced with sideports extending from the tip of the vascular sheath to the central aspect of the left common iliac vein stent.  The vascular sheath was sutured in place with an  interrupted suture.  A dressing was placed.  The patient tolerated the procedure well without immediate postprocedural complication.  The infusion catheter was connected to [facility] and the infusion was initiated.  Impression:  1.  Extensive occlusive thrombus extending from the level of the left popliteal vein through the central aspect of the previously placed left common iliac vein stent.  The IVC is widely patent.  2. 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  3.  Successful placement of an infusion catheter with sideports extending from the vascular sheath tip to the central aspect of the left common iliac vein stent.  3.  The patient was transferred to ICU for overnight TNKase infusion and will return tomorrow morning for repeat venogram and possible intervention.  Original Report Authenticated By: Waynard Reeds, M.D.   Ir Transcath/rx/inf  08/07/2011  *RADIOLOGY REPORT*  Indication: Recurrent left lower extremity DVT  LEFT LOWER EXTERMITY VENOGRAM AND PLACEMENT OF INFUSION CATHETER ULTRASOUND GUIDANCE FOR VASCULAR ACCESS  Comparisons: Left lower extremity venous lysis, angioplasty and stent placement - 08/03/2011  Intravenous Medications: Fentanyl 75 mcg IV; Versed 2 mg IV  Contrast: 50 ml Omnipaque-300  Total Moderate Sedation Time: 18 minutes  Fluoroscopy Time: 4.7 minutes.  Complications: None immediate  Technique / Findings:  Informed written consent was obtained from the patient and the patient's mother after a discussion of the risks, benefits and alternatives to treatment.  Questions regarding the procedure were encouraged and answered.  A timeout was performed prior to the initiation of the procedure.  The patient was placed prone on the fluoroscopy table and the skin posterior to the left knee was prepped and draped in the usual sterile fashion, and a sterile drape was applied covering the operative field.  Maximum barrier  sterile technique with sterile gowns and gloves were used for the procedure.  Local anesthesia was provided with 1% lidocaine.  Ultrasound scanning demonstrates the popliteal vein is enlarged, filled with echogenic material and noncompressible, compatible with complete occlusive thrombus.  The left above knee popliteal vein was accessed with a micropuncture needle under direct ultrasound guidance.  This allowed for placement of a 6-French vascular sheath. The limited venogram was performed through the side arm of the sheath demonstrating complete occlusion of the popliteal vein.  With the use of a regular glide wire, a Kumpe catheter was advanced through the left femoral, external iliac vein and through the left common iliac vein to the level of the IVC.  Limited venograms were performed in multiple stations confirming occlusive thrombus throughout the left lower extremity extending from the left popliteal to the common iliac vein stent.  An inferior venacavagram was performed demonstrating wide patency of the IVC.  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  A wire was utilized for measuring purposes and over an Amplatz super stiff wire, a 90 cm/50-cm infusion catheter was advanced with sideports extending from the tip of the vascular sheath to  the central aspect of the left common iliac vein stent.  The vascular sheath was sutured in place with an interrupted suture.  A dressing was placed.  The patient tolerated the procedure well without immediate postprocedural complication.  The infusion catheter was connected to [facility] and the infusion was initiated.  Impression:  1.  Extensive occlusive thrombus extending from the level of the left popliteal vein through the central aspect of the previously placed left common  iliac vein stent.  The IVC is widely patent.  2. 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  3.  Successful placement of an infusion catheter with sideports extending from the vascular sheath tip to the central aspect of the left common iliac vein stent.  3.  The patient was transferred to ICU for overnight TNKase infusion and will return tomorrow morning for repeat venogram and possible intervention.  Original Report Authenticated By: Waynard Reeds, M.D.   Ir US Guide Vasc Access Left  08/07/2011  *RADIOLOGY REPORT*  Indication: Recurrent left lower extremity DVT  LEFT LOWER EXTERMITY VENOGRAM AND PLACEMENT OF INFUSION CATHETER ULTRASOUND GUIDANCE FOR VASCULAR ACCESS  Comparisons: Left lower extremity venous lysis, angioplasty and stent placement - 08/03/2011  Intravenous Medications: Fentanyl 75 mcg IV; Versed 2 mg IV  Contrast: 50 ml Omnipaque-300  Total Moderate Sedation Time: 18 minutes  Fluoroscopy Time: 4.7 minutes.  Complications: None immediate  Technique / Findings:  Informed written consent was obtained from the patient and the patient's mother after a discussion of the risks, benefits and alternatives to treatment.  Questions regarding the procedure were encouraged and answered.  A timeout was performed prior to the initiation of the procedure.  The patient was placed prone on the fluoroscopy table and the skin posterior to the left knee was prepped and draped in the usual sterile fashion, and a sterile drape was applied covering the operative field.  Maximum barrier sterile technique with sterile gowns and gloves were used for the procedure.  Local anesthesia was provided with 1% lidocaine.  Ultrasound scanning demonstrates the popliteal vein is enlarged, filled with echogenic material and noncompressible, compatible with complete occlusive thrombus.  The left above knee popliteal vein was accessed with a micropuncture needle  under direct ultrasound guidance.  This allowed for placement of a 6-French vascular sheath. The limited venogram was performed through the side arm of the sheath demonstrating complete occlusion of the popliteal vein.  With the use of a regular glide wire, a Kumpe catheter was advanced through the left femoral, external iliac vein and through the left common iliac vein to the level of the IVC.  Limited venograms were performed in multiple stations confirming occlusive thrombus throughout the left lower extremity extending from the left popliteal to the common iliac vein stent.  An inferior venacavagram was performed demonstrating wide patency of the IVC.  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  A wire was utilized for measuring purposes and over an Amplatz super stiff wire, a 90 cm/50-cm infusion catheter was advanced with sideports extending from the tip of the vascular sheath to the central aspect of the left common iliac vein stent.  The vascular sheath was sutured in place with an interrupted suture.  A dressing was placed.  The patient tolerated  the procedure well without immediate postprocedural complication.  The infusion catheter was connected to [facility] and the infusion was initiated.  Impression:  1.  Extensive occlusive thrombus extending from the level of the left popliteal vein through the central aspect of the previously placed left common iliac vein stent.  The IVC is widely patent.  2. 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  3.  Successful placement of an infusion catheter with sideports extending from the vascular sheath tip to the central aspect of the left common iliac vein stent.  3.  The patient was transferred to ICU for  overnight TNKase infusion and will return tomorrow morning for repeat venogram and possible intervention.  Original Report Authenticated By: Waynard Reeds, M.D.    Anti-infectives: Anti-infectives    None      Assessment/Plan: s/p  Thrombolysis of left lower extremity venous thrombosis.  Persistent left lower extremity swelling. Continue TNK today and plan for follow up angio today.  LOS: 1 day    Willie Charles 08/08/2011

## 2011-08-08 NOTE — Progress Notes (Signed)
Heparin drip turned off per order received from pharmacist due to recent heparin level result. Awaiting further orders

## 2011-08-08 NOTE — Progress Notes (Signed)
Dr Grace Isaac telephoned to follow up on patient's status; provided with an update re: events of the night shift(fevers, inability to urinate). Was informed to leave heparin infusing until call received from IR later this morning. Patient, family and oncoming RN provided with updated information.

## 2011-08-08 NOTE — Progress Notes (Signed)
Pharmacy Note: Spoke with Dr Lowella Dandy, Radiology and Dr. Rito Ehrlich, [facility] to review plans for heparin.  Currently desire no heparin at this time.  To consider resuming as INR normalizes and bleeding risk decreases.  Will follow peripherally for now.  Thanks!  Makayla Confer L. Illene Bolus, PharmD, BCPS Clinical Pharmacist Pager: [phone] 08/08/2011 5:36 PM

## 2011-08-08 NOTE — Procedures (Signed)
Post-Procedure Note  Pre-operative Diagnosis: Left lower extremity DVT.  Follow up thrombolysis       Post-operative Diagnosis: Same   Indications:  DVT.  Procedure Details:  The patient's INR level remained markedly elevated and concerned about safety of continuing thrombolysis with elevated INR level.  TNK and heparin were stopped prior to procedure.  Lower extremity venogram was performed and catheters completely removed. Findings: Persistent occlusive thrombus in left thigh and left iliac veins.  IVC patent.  Complications: None     Condition: Stable  Plan: Stop thrombolysis for now.  Wait for INR to normalize and consider additional thrombolysis.

## 2011-08-08 NOTE — Progress Notes (Signed)
Patient ID: Willie Charles, male   DOB: 11/13/1990, 20 y.o.   MRN: [id number] The thrombolysis of the left lower extremity and left pelvis was stopped due to markedly elevated INR level.  Concerned about continuing thrombolysis with this INR level.  Follow-up venogram demonstrated residual occlusive thrombus in left lower extremity and feel that patient would benefit from additional thrombolysis when INR is closer to normal.  Discussed this patient with [facility], Dr. Rito Ehrlich,  who agreed to take on their service and help manage coagulation issues.  IR will continue to follow.

## 2011-08-09 DIAGNOSIS — I999 Unspecified disorder of circulatory system: Secondary | ICD-10-CM

## 2011-08-09 DIAGNOSIS — D689 Coagulation defect, unspecified: Secondary | ICD-10-CM | POA: Diagnosis present

## 2011-08-09 DIAGNOSIS — L539 Erythematous condition, unspecified: Secondary | ICD-10-CM

## 2011-08-09 DIAGNOSIS — I82890 Acute embolism and thrombosis of other specified veins: Secondary | ICD-10-CM

## 2011-08-09 LAB — PROTIME-INR
INR: 5.51 (ref 0.00–1.49)
Prothrombin Time: 50.8 s — ABNORMAL HIGH (ref 11.6–15.2)

## 2011-08-09 MED ORDER — HEPARIN SOD (PORCINE) IN D5W 100 UNIT/ML IV SOLN
2300.0000 [IU]/h | INTRAVENOUS | Status: DC
  Administered 2011-08-09: 1950 [IU]/h via INTRAVENOUS
  Administered 2011-08-11 – 2011-08-12 (×2): 2300 [IU]/h via INTRAVENOUS
  Filled 2011-08-09 (×9): qty 250

## 2011-08-09 MED ORDER — SODIUM CHLORIDE 0.9 % IV SOLN: INTRAVENOUS | Status: DC

## 2011-08-09 MED ORDER — MORPHINE SULFATE 2 MG/ML IJ SOLN
INTRAMUSCULAR | Status: AC
  Administered 2011-08-09: 2 mg via INTRAVENOUS
  Filled 2011-08-09: qty 1

## 2011-08-09 MED ORDER — VITAMIN K1 10 MG/ML IJ SOLN
10.0000 mg | Freq: Once | INTRAMUSCULAR | Status: AC
  Administered 2011-08-09: 10 mg via SUBCUTANEOUS
  Filled 2011-08-09: qty 1

## 2011-08-09 MED ORDER — MORPHINE SULFATE 2 MG/ML IJ SOLN
2.0000 mg | INTRAMUSCULAR | Status: DC | PRN
  Administered 2011-08-09 – 2011-08-12 (×13): 2 mg via INTRAVENOUS
  Filled 2011-08-09 (×12): qty 1

## 2011-08-09 MED ORDER — SODIUM CHLORIDE 0.9 % IV SOLN
3.0000 g | Freq: Four times a day (QID) | INTRAVENOUS | Status: DC
  Administered 2011-08-09 – 2011-08-14 (×18): 3 g via INTRAVENOUS
  Filled 2011-08-09 (×21): qty 3

## 2011-08-09 NOTE — Progress Notes (Signed)
Subjective: Patient and family feel that left leg swelling is worse.   Objective: Vital signs in last 24 hours: Temp:  [98.4 F (36.9 C)-101.6 F (38.7 C)] 100 F (37.8 C) (12/29 1353) Pulse Rate:  [83-112] 88  (12/29 1353) Resp:  [6-24] 18  (12/29 1353) BP: (95-118)/(52-68) 101/62 mmHg (12/29 1353) SpO2:  [0 %-100 %] 96 % (12/29 1353) Weight:  [124 lb 5.4 oz (56.4 kg)] 124 lb 5.4 oz (56.4 kg) (12/28 1713) Last BM Date: 08/07/11  Intake/Output from previous day: 12/28 0701 - 12/29 0700 In: 2020.5 [P.O.:760; I.V.:1260.5] Out: 2400 [Urine:2400] Intake/Output this shift: Total I/O In: -  Out: 750 [Urine:750]  PE:  Increased swelling in left foot and calf since yesterday.  Faint pedal pulses due to swelling.  Lab Results:   [facility] 08/08/11 0640 08/07/11 1441  WBC 8.7 8.9  HGB 12.3* 13.6  HCT 36.5* 39.5  PLT 341 437*   BMET  Basename 08/08/11 0640 08/07/11 1441  NA -- 134*  K 4.7 4.5  CL -- 94*  CO2 -- 27  GLUCOSE -- 99  BUN -- 12  CREATININE 0.70 0.72  CALCIUM -- 9.5   PT/INR  Basename 08/09/11 0700 08/08/11 0640  LABPROT 50.8* 53.4*  INR 5.51* 5.87*   ABG No results found for this basename: PHART:2,PCO2:2,PO2:2,HCO3:2 in the last 72 hours  Studies/Results: Ir Veno/ext/uni Left  08/07/2011  *RADIOLOGY REPORT*  Indication: Recurrent left lower extremity DVT  LEFT LOWER EXTERMITY VENOGRAM AND PLACEMENT OF INFUSION CATHETER ULTRASOUND GUIDANCE FOR VASCULAR ACCESS  Comparisons: Left lower extremity venous lysis, angioplasty and stent placement - 08/03/2011  Intravenous Medications: Fentanyl 75 mcg IV; Versed 2 mg IV  Contrast: 50 ml Omnipaque-300  Total Moderate Sedation Time: 18 minutes  Fluoroscopy Time: 4.7 minutes.  Complications: None immediate  Technique / Findings:  Informed written consent was obtained from the patient and the patient's mother after a discussion of the risks, benefits and alternatives to treatment.  Questions regarding the procedure  were encouraged and answered.  A timeout was performed prior to the initiation of the procedure.  The patient was placed prone on the fluoroscopy table and the skin posterior to the left knee was prepped and draped in the usual sterile fashion, and a sterile drape was applied covering the operative field.  Maximum barrier sterile technique with sterile gowns and gloves were used for the procedure.  Local anesthesia was provided with 1% lidocaine.  Ultrasound scanning demonstrates the popliteal vein is enlarged, filled with echogenic material and noncompressible, compatible with complete occlusive thrombus.  The left above knee popliteal vein was accessed with a micropuncture needle under direct ultrasound guidance.  This allowed for placement of a 6-French vascular sheath. The limited venogram was performed through the side arm of the sheath demonstrating complete occlusion of the popliteal vein.  With the use of a regular glide wire, a Kumpe catheter was advanced through the left femoral, external iliac vein and through the left common iliac vein to the level of the IVC.  Limited venograms were performed in multiple stations confirming occlusive thrombus throughout the left lower extremity extending from the left popliteal to the common iliac vein stent.  An inferior venacavagram was performed demonstrating wide patency of the IVC.  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  A wire was  utilized for measuring purposes and over an Amplatz super stiff wire, a 90 cm/50-cm infusion catheter was advanced with sideports extending from the tip of the vascular sheath to the central aspect of the left common iliac vein stent.  The vascular sheath was sutured in place with an interrupted suture.  A dressing was placed.  The  patient tolerated the procedure well without immediate postprocedural complication.  The infusion catheter was connected to [facility] and the infusion was initiated.  Impression:  1.  Extensive occlusive thrombus extending from the level of the left popliteal vein through the central aspect of the previously placed left common iliac vein stent.  The IVC is widely patent.  2. 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  3.  Successful placement of an infusion catheter with sideports extending from the vascular sheath tip to the central aspect of the left common iliac vein stent.  3.  The patient was transferred to ICU for overnight TNKase infusion and will return tomorrow morning for repeat venogram and possible intervention.  Original Report Authenticated By: Waynard Reeds, M.D.   Ir Transcath/rx/inf  08/07/2011  *RADIOLOGY REPORT*  Indication: Recurrent left lower extremity DVT  LEFT LOWER EXTERMITY VENOGRAM AND PLACEMENT OF INFUSION CATHETER ULTRASOUND GUIDANCE FOR VASCULAR ACCESS  Comparisons: Left lower extremity venous lysis, angioplasty and stent placement - 08/03/2011  Intravenous Medications: Fentanyl 75 mcg IV; Versed 2 mg IV  Contrast: 50 ml Omnipaque-300  Total Moderate Sedation Time: 18 minutes  Fluoroscopy Time: 4.7 minutes.  Complications: None immediate  Technique / Findings:  Informed written consent was obtained from the patient and the patient's mother after a discussion of the risks, benefits and alternatives to treatment.  Questions regarding the procedure were encouraged and answered.  A timeout was performed prior to the initiation of the procedure.  The patient was placed prone on the fluoroscopy table and the skin posterior to the left knee was prepped and draped in the usual sterile fashion, and a sterile drape was applied covering the operative field.  Maximum barrier sterile technique with sterile gowns and gloves were  used for the procedure.  Local anesthesia was provided with 1% lidocaine.  Ultrasound scanning demonstrates the popliteal vein is enlarged, filled with echogenic material and noncompressible, compatible with complete occlusive thrombus.  The left above knee popliteal vein was accessed with a micropuncture needle under direct ultrasound guidance.  This allowed for placement of a 6-French vascular sheath. The limited venogram was performed through the side arm of the sheath demonstrating complete occlusion of the popliteal vein.  With the use of a regular glide wire, a Kumpe catheter was advanced through the left femoral, external iliac vein and through the left common iliac vein to the level of the IVC.  Limited venograms were performed in multiple stations confirming occlusive thrombus throughout the left lower extremity extending from the left popliteal to the common iliac vein stent.  An inferior venacavagram was performed demonstrating wide patency of the IVC.  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  A wire was utilized for measuring purposes and over an Amplatz super stiff wire, a 90 cm/50-cm infusion catheter was advanced with sideports extending from the tip of the vascular sheath to the central aspect of the left  common iliac vein stent.  The vascular sheath was sutured in place with an interrupted suture.  A dressing was placed.  The patient tolerated the procedure well without immediate postprocedural complication.  The infusion catheter was connected to [facility] and the infusion was initiated.  Impression:  1.  Extensive occlusive thrombus extending from the level of the left popliteal vein through the central aspect of the previously placed left common iliac vein stent.  The IVC is widely patent.  2.  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  3.  Successful placement of an infusion catheter with sideports extending from the vascular sheath tip to the central aspect of the left common iliac vein stent.  3.  The patient was transferred to ICU for overnight TNKase infusion and will return tomorrow morning for repeat venogram and possible intervention.  Original Report Authenticated By: Waynard Reeds, M.D.   Ir Angiogram Follow Up Study  08/08/2011  *RADIOLOGY REPORT*  Clinical history:20 year old with left lower extremity and pelvic deep vein thrombosis.  The patient started thrombolytic therapy on 08/07/2011.  Thrombolytic therapy was stopped earlier in the day due to an elevated INR level.  PROCEDURE(S): ANGIOGRAM THROUGH EXISTING CATHETER.  Physician: Rachelle Hora. Jeremie Giangrande, MD  Medications:None  Moderate sedation time:None  Fluoroscopy time: 2.9 minutes  Procedure:The patient was placed prone on the interventional table. The left popliteal region was prepped and draped in a sterile fashion.  Maximal barrier sterile technique was utilized including caps, mask, sterile gowns, sterile gloves, sterile drape, hand hygiene and skin antiseptic.  Contrast was injected through the infusion catheter.  The inner portion of the infusion catheter was removed and contrast was injected through the infusion catheter as it was pulled back.  Catheter was removed over a Bentson wire.  A 5- [place] Kumpe catheter was advanced in the IVC and a pullback venogram was performed.  Blood could not be aspirated from the popliteal vein sheath.  The catheter and sheath were completely removed with manual compression.  Findings:The IVC is patent.  There is occlusive thrombus throughout the left femoral vein, femoral vein and left iliac veins.  There is a small channel of flow corresponding with the previous infusion catheter.  Complications: None  Impression:Persistent  occlusive thrombus throughout the left thigh and left side of the pelvis.  Thrombolytic therapy was stopped due to the elevated INR level.  Original Report Authenticated By: Richarda Overlie, M.D.   Ir US Guide Vasc Access Left  08/07/2011  *RADIOLOGY REPORT*  Indication: Recurrent left lower extremity DVT  LEFT LOWER EXTERMITY VENOGRAM AND PLACEMENT OF INFUSION CATHETER ULTRASOUND GUIDANCE FOR VASCULAR ACCESS  Comparisons: Left lower extremity venous lysis, angioplasty and stent placement - 08/03/2011  Intravenous Medications: Fentanyl 75 mcg IV; Versed 2 mg IV  Contrast: 50 ml Omnipaque-300  Total Moderate Sedation Time: 18 minutes  Fluoroscopy Time: 4.7 minutes.  Complications: None immediate  Technique / Findings:  Informed written consent was obtained from the patient and the patient's mother after a discussion of the risks, benefits and alternatives to treatment.  Questions regarding the procedure were encouraged and answered.  A timeout was performed prior to the initiation of the procedure.  The patient was placed prone on the fluoroscopy table and the skin posterior to the left knee was prepped and draped in the usual sterile fashion, and a sterile drape was applied covering the operative field.  Maximum barrier sterile technique with sterile gowns and  gloves were used for the procedure.  Local anesthesia was provided with 1% lidocaine.  Ultrasound scanning demonstrates the popliteal vein is enlarged, filled with echogenic material and noncompressible, compatible with complete occlusive thrombus.  The left above knee popliteal vein was accessed with a micropuncture needle under direct ultrasound guidance.  This allowed for placement of a 6-French vascular sheath. The limited venogram was performed through the side arm of the sheath demonstrating complete occlusion of the popliteal vein.  With the use of a regular glide wire, a Kumpe catheter was advanced through the left femoral, external iliac vein and  through the left common iliac vein to the level of the IVC.  Limited venograms were performed in multiple stations confirming occlusive thrombus throughout the left lower extremity extending from the left popliteal to the common iliac vein stent.  An inferior venacavagram was performed demonstrating wide patency of the IVC.  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  A wire was utilized for measuring purposes and over an Amplatz super stiff wire, a 90 cm/50-cm infusion catheter was advanced with sideports extending from the tip of the vascular sheath to the central aspect of the left common iliac vein stent.  The vascular sheath was sutured in place with an interrupted suture.  A dressing was placed.  The patient tolerated the procedure well without immediate postprocedural complication.  The infusion catheter was connected to [facility] and the infusion was initiated.  Impression:  1.  Extensive occlusive thrombus extending from the level of the left popliteal vein through the central aspect of the previously placed left common iliac vein stent.  The IVC is widely patent.  2. 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  3.  Successful placement of an infusion catheter with sideports extending from the vascular sheath tip to the central aspect of the left common iliac vein stent.  3.  The patient was transferred to ICU for overnight TNKase infusion and will return tomorrow morning for repeat venogram and possible intervention.  Original Report Authenticated By: Waynard Reeds, M.D.    Anti-infectives: Anti-infectives    None      Assessment/Plan: s/p  Left lower extremity venogram and thrombolysis was stopped early due to elevated INR level.   Feel that left lower extremity swelling is getting worse despite elevated INR level.   Concerned that INR level is not coming down as would be expected and patient does not appear to be adequately anticoagulated.  Discussed getting a Hematology consult and starting heparin with Dr. Amador Cunas.  Will continue to follow.  Would like to pursue additional thrombolysis if coagulation status can be resolved.   LOS: 2 days    Egidio Lofgren RYAN 08/09/2011

## 2011-08-09 NOTE — Progress Notes (Signed)
Subjective: 20 year old patient who was admitted to the hospital on 12/21 initially and diagnosed on 12/24. At that time he was admitted for evaluation and treatment of extensive left lower extremity deep vein thrombosis. He underwent mechanical thrombectomy with stent placement and discharge on Coumadin anticoagulation. 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 He underwent venogram with thrombectomy and thrombolysis. 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 He continues to have mild pain involving the left lower extremity as well as extensive edema. Objective: Vital signs in last 24 hours: Temp:  [98.4 F (36.9 C)-101.6 F (38.7 C)] 98.6 F (37 C) (12/29 0552) Pulse Rate:  [81-112] 83  (12/29 0500) Resp:  [6-24] 19  (12/29 0500) BP: (95-118)/(52-68) 117/66 mmHg (12/29 0500) SpO2:  [0 %-100 %] 0 % (12/29 0500) Weight:  [56.4 kg (124 lb 5.4 oz)] 124 lb 5.4 oz (56.4 kg) (12/28 1713) Weight change: -1.207 kg (-2 lb 10.6 oz) Last BM Date: 08/07/11  Intake/Output from previous day: 12/28 0701 - 12/29 0700 In: 1900.5 [P.O.:640; I.V.:1260.5] Out: 2400 [Urine:2400] Intake/Output this shift:    Patient is alert comfortable cooperative and in no distress Chest was clear to auscultation. Cardiovascular exam revealed rate to be approximately 90. No tachycardia Abdomen soft and nontender except for mild tenderness in the left groin area Extremities right leg was unremarkable. He had extensive edema involving the entire left leg that included both the ankle and foot. The leg was slightly tender and warm to touch.   Lab Results:  [facility] 08/08/11 0640 08/07/11 1441  WBC 8.7 8.9  HGB 12.3* 13.6  HCT 36.5* 39.5  PLT 341 437*   BMET  Basename 08/08/11 0640 08/07/11  1441  NA -- 134*  K 4.7 4.5  CL -- 94*  CO2 -- 27  GLUCOSE -- 99  BUN -- 12  CREATININE 0.70 0.72  CALCIUM -- 9.5   Lab Results  Component Value Date   INR 5.51* 08/09/2011   INR 5.87* 08/08/2011   INR 5.82* 08/07/2011    Studies/Results: Ir Veno/ext/uni Left  08/07/2011  *RADIOLOGY REPORT*  Indication: Recurrent left lower extremity DVT  LEFT LOWER EXTERMITY VENOGRAM AND PLACEMENT OF INFUSION CATHETER ULTRASOUND GUIDANCE FOR VASCULAR ACCESS  Comparisons: Left lower extremity venous lysis, angioplasty and stent placement - 08/03/2011  Intravenous Medications: Fentanyl 75 mcg IV; Versed 2 mg IV  Contrast: 50 ml Omnipaque-300  Total Moderate Sedation Time: 18 minutes  Fluoroscopy Time: 4.7 minutes.  Complications: None immediate  Technique / Findings:  Informed written consent was obtained from the patient and the patient's mother after a discussion of the risks, benefits and alternatives to treatment.  Questions regarding the procedure were encouraged and answered.  A timeout was performed prior to the initiation of the procedure.  The patient was placed prone on the fluoroscopy table and the skin posterior to the left knee was prepped and draped in the usual sterile fashion, and a sterile drape was applied covering the operative field.  Maximum barrier sterile technique with sterile gowns and gloves were used for the procedure.  Local anesthesia was provided with 1% lidocaine.  Ultrasound scanning demonstrates the popliteal vein is enlarged, filled with echogenic material and noncompressible, compatible with complete occlusive thrombus.  The left above knee popliteal vein was accessed with  a micropuncture needle under direct ultrasound guidance.  This allowed for placement of a 6-French vascular sheath. The limited venogram was performed through the side arm of the sheath demonstrating complete occlusion of the popliteal vein.  With the use of a regular glide wire, a Kumpe catheter was advanced  through the left femoral, external iliac vein and through the left common iliac vein to the level of the IVC.  Limited venograms were performed in multiple stations confirming occlusive thrombus throughout the left lower extremity extending from the left popliteal to the common iliac vein stent.  An inferior venacavagram was performed demonstrating wide patency of the IVC.  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  A wire was utilized for measuring purposes and over an Amplatz super stiff wire, a 90 cm/50-cm infusion catheter was advanced with sideports extending from the tip of the vascular sheath to the central aspect of the left common iliac vein stent.  The vascular sheath was sutured in place with an interrupted suture.  A dressing was placed.  The patient tolerated the procedure well without immediate postprocedural complication.  The infusion catheter was connected to [facility] and the infusion was initiated.  Impression:  1.  Extensive occlusive thrombus extending from the level of the left popliteal vein through the central aspect of the previously placed left common iliac vein stent.  The IVC is widely patent.  2. 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  3.  Successful placement of an infusion catheter with sideports extending from the vascular sheath tip to the central aspect of the left common iliac vein stent.  3.  The patient was transferred to ICU for overnight TNKase infusion and will return tomorrow morning for repeat venogram and possible intervention.  Original Report Authenticated By: Waynard Reeds, M.D.   Ir Transcath/rx/inf  08/07/2011  *RADIOLOGY REPORT*  Indication: Recurrent left lower extremity DVT  LEFT LOWER  EXTERMITY VENOGRAM AND PLACEMENT OF INFUSION CATHETER ULTRASOUND GUIDANCE FOR VASCULAR ACCESS  Comparisons: Left lower extremity venous lysis, angioplasty and stent placement - 08/03/2011  Intravenous Medications: Fentanyl 75 mcg IV; Versed 2 mg IV  Contrast: 50 ml Omnipaque-300  Total Moderate Sedation Time: 18 minutes  Fluoroscopy Time: 4.7 minutes.  Complications: None immediate  Technique / Findings:  Informed written consent was obtained from the patient and the patient's mother after a discussion of the risks, benefits and alternatives to treatment.  Questions regarding the procedure were encouraged and answered.  A timeout was performed prior to the initiation of the procedure.  The patient was placed prone on the fluoroscopy table and the skin posterior to the left knee was prepped and draped in the usual sterile fashion, and a sterile drape was applied covering the operative field.  Maximum barrier sterile technique with sterile gowns and gloves were used for the procedure.  Local anesthesia was provided with 1% lidocaine.  Ultrasound scanning demonstrates the popliteal vein is enlarged, filled with echogenic material and noncompressible, compatible with complete occlusive thrombus.  The left above knee popliteal vein was accessed with a micropuncture needle under direct ultrasound guidance.  This allowed for placement of a 6-French vascular sheath. The limited venogram was performed through the side arm of the sheath demonstrating complete occlusion of the popliteal  vein.  With the use of a regular glide wire, a Kumpe catheter was advanced through the left femoral, external iliac vein and through the left common iliac vein to the level of the IVC.  Limited venograms were performed in multiple stations confirming occlusive thrombus throughout the left lower extremity extending from the left popliteal to the common iliac vein stent.  An inferior venacavagram was performed demonstrating wide patency of the  IVC.  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  A wire was utilized for measuring purposes and over an Amplatz super stiff wire, a 90 cm/50-cm infusion catheter was advanced with sideports extending from the tip of the vascular sheath to the central aspect of the left common iliac vein stent.  The vascular sheath was sutured in place with an interrupted suture.  A dressing was placed.  The patient tolerated the procedure well without immediate postprocedural complication.  The infusion catheter was connected to [facility] and the infusion was initiated.  Impression:  1.  Extensive occlusive thrombus extending from the level of the left popliteal vein through the central aspect of the previously placed left common iliac vein stent.  The IVC is widely patent.  2. 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  3.  Successful placement of an infusion catheter with sideports extending from the vascular sheath tip to the central aspect of the left common iliac vein stent.  3.  The patient was transferred to ICU for overnight TNKase infusion and will return tomorrow morning for repeat venogram and possible intervention.  Original Report Authenticated By: Waynard Reeds, M.D.   Ir Angiogram Follow Up Study  08/08/2011  *RADIOLOGY REPORT*  Clinical history:20 year old with left lower extremity and pelvic deep vein thrombosis.  The patient started thrombolytic therapy on 08/07/2011.  Thrombolytic therapy was stopped earlier in the day due to an elevated INR level.  PROCEDURE(S): ANGIOGRAM THROUGH EXISTING CATHETER.  Physician: Rachelle Hora. Henn, MD  Medications:None  Moderate sedation time:None  Fluoroscopy time: 2.9 minutes  Procedure:The patient was placed prone  on the interventional table. The left popliteal region was prepped and draped in a sterile fashion.  Maximal barrier sterile technique was utilized including caps, mask, sterile gowns, sterile gloves, sterile drape, hand hygiene and skin antiseptic.  Contrast was injected through the infusion catheter.  The inner portion of the infusion catheter was removed and contrast was injected through the infusion catheter as it was pulled back.  Catheter was removed over a Bentson wire.  A 5- [place] Kumpe catheter was advanced in the IVC and a pullback venogram was performed.  Blood could not be aspirated from the popliteal vein sheath.  The catheter and sheath were completely removed with manual compression.  Findings:The IVC is patent.  There is occlusive thrombus throughout the left femoral vein, femoral vein and left iliac veins.  There is a small channel of flow corresponding with the previous infusion catheter.  Complications: None  Impression:Persistent occlusive thrombus throughout the left thigh and left side of the pelvis.  Thrombolytic therapy was stopped due to the elevated INR level.  Original Report Authenticated By: Richarda Overlie, M.D.   Ir US Guide Vasc Access Left  08/07/2011  *RADIOLOGY REPORT*  Indication: Recurrent left lower extremity DVT  LEFT LOWER  EXTERMITY VENOGRAM AND PLACEMENT OF INFUSION CATHETER ULTRASOUND GUIDANCE FOR VASCULAR ACCESS  Comparisons: Left lower extremity venous lysis, angioplasty and stent placement - 08/03/2011  Intravenous Medications: Fentanyl 75 mcg IV; Versed 2 mg IV  Contrast: 50 ml Omnipaque-300  Total Moderate Sedation Time: 18 minutes  Fluoroscopy Time: 4.7 minutes.  Complications: None immediate  Technique / Findings:  Informed written consent was obtained from the patient and the patient's mother after a discussion of the risks, benefits and alternatives to treatment.  Questions regarding the procedure were encouraged and answered.  A timeout was performed prior to the  initiation of the procedure.  The patient was placed prone on the fluoroscopy table and the skin posterior to the left knee was prepped and draped in the usual sterile fashion, and a sterile drape was applied covering the operative field.  Maximum barrier sterile technique with sterile gowns and gloves were used for the procedure.  Local anesthesia was provided with 1% lidocaine.  Ultrasound scanning demonstrates the popliteal vein is enlarged, filled with echogenic material and noncompressible, compatible with complete occlusive thrombus.  The left above knee popliteal vein was accessed with a micropuncture needle under direct ultrasound guidance.  This allowed for placement of a 6-French vascular sheath. The limited venogram was performed through the side arm of the sheath demonstrating complete occlusion of the popliteal vein.  With the use of a regular glide wire, a Kumpe catheter was advanced through the left femoral, external iliac vein and through the left common iliac vein to the level of the IVC.  Limited venograms were performed in multiple stations confirming occlusive thrombus throughout the left lower extremity extending from the left popliteal to the common iliac vein stent.  An inferior venacavagram was performed demonstrating wide patency of the IVC.  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  A wire was utilized for measuring purposes and over an Amplatz super stiff wire, a 90 cm/50-cm infusion catheter was advanced with sideports extending from the tip of the vascular sheath to the central aspect of the left common iliac vein stent.  The vascular sheath was sutured in place with an interrupted suture.  A dressing was placed.  The patient tolerated the procedure well without immediate postprocedural  complication.  The infusion catheter was connected to [facility] and the infusion was initiated.  Impression:  1.  Extensive occlusive thrombus extending from the level of the left popliteal vein through the central aspect of the previously placed left common iliac vein stent.  The IVC is widely patent.  2. 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  3.  Successful placement of an infusion catheter with sideports extending from the vascular sheath tip to the central aspect of the left common iliac vein stent.  3.  The patient was transferred to ICU for overnight TNKase infusion and will return tomorrow morning for repeat venogram and possible intervention.  Original Report Authenticated By: Waynard Reeds, M.D.    Medications: I have reviewed the patient's current medications.  Assessment/Plan: #1 extensive left leg DVT - status post thrombectomy and thrombolysis  #2 May Turner syndrome  #3 Coumadin coagulopathy. Coumadin and heparin presently on hold. We'll continue to observe daily INRs.   LOS: 2 days   Rogelia Boga  08/09/2011, 8:38 AM

## 2011-08-09 NOTE — Progress Notes (Signed)
ANTICOAGULATION CONSULT NOTE - Initial Consult  Pharmacy Consult for Heparin Indication: DVT  No Known Allergies  Patient Measurements: Height: 6' (182.9 cm) Weight: 124 lb 5.4 oz (56.4 kg) IBW/kg (Calculated) : 77.6  Heparin Dosing Weight: 56.4 kg  Vital Signs: Temp: 100 F (37.8 C) (12/29 1353) Temp src: Oral (12/29 1353) BP: 101/62 mmHg (12/29 1353) Pulse Rate: 88  (12/29 1353)  Labs:  Basename 08/09/11 0700 08/08/11 0640 08/08/11 0125 08/07/11 1609 08/07/11 1441 08/07/11 1311  HGB -- 12.3* -- -- 13.6 --  HCT -- 36.5* -- -- 39.5 --  PLT -- 341 -- -- 437* --  APTT -- -- -- 61* -- --  LABPROT 50.8* 53.4* -- -- -- 53.1*  INR 5.51* 5.87* -- -- -- 5.82*  HEPARINUNFRC -- -- 0.81* -- -- --  CREATININE -- 0.70 -- -- 0.72 --  CKTOTAL -- -- -- -- -- --  CKMB -- -- -- -- -- --  TROPONINI -- -- -- -- -- --   Estimated Creatinine Clearance: 117.5 ml/min (by C-G formula based on Cr of 0.7).  Medical History: Past Medical History  Diagnosis Date  . No pertinent past medical history   . DVT (deep venous thrombosis)     Assessment: 20 yo M with recent admission for DVT s/p thrombolysis and stent placement on 08/03/11.  Discharged home on Lovenox --> Coumadin bridge therapy.  Returns to ED 12/27 with increased pain and swelling, doppler confirmed recurrent DVT despite anticoagulation and elevated INR.  At this point the swelling appears to be worse and the physician has decided to initiate heparin for anticoagulation.  The patient is not adequately anticoagulated (as evidenced by new DVT) despite elevated INR.  Pt was previously therapeutic on heparin (heparin level 0.43) on heparin rate of 1950 units/hr.     Goal of Therapy:  Heparin level 0.3-0.7 units/ml (will target lower end of goal for now considering patient is at a high risk for bleed with elevated INR)   Plan:  Start heparin infusion at 1950 units/hr. Will not bolus heparin 2/2 elevated INR. Heparin level in 8  hours. Heparin level and CBC daily.   [employer], Pharm.D., BCPS Clinical Pharmacist Pager [phone]  08/09/2011,3:52 PM

## 2011-08-09 NOTE — Progress Notes (Signed)
Patient ID: Willie Charles, male   DOB: 05-21-1991, 20 y.o.   MRN: [id number]   Lt leg DVT per [place] 12/28 Off coumadin now ; INR 5.82 12/27 Now 5.51  Left leg still has edema; pt and parents think actually Some l;arger than yesterday. Foot warm; NT Leg warm; NT  Discussed status with Dr Lowella Dandy

## 2011-08-09 NOTE — Progress Notes (Signed)
CRITICAL VALUE ALERT  Critical value received:  5.51  Date of notification: 08/09/11  Time of notification:  0800  Critical value read back:yes  Nurse who received alert: Lethea Killings  MD notified (1st page):  Dr Ellin Goodie  Time of first page:  [phone]  MD notified (2nd page):  Time of second page:  Responding MD:  Dr Ellin Goodie  Time MD responded:  [phone]

## 2011-08-09 NOTE — Consults (Signed)
PCP: None   Chief Complaint: Propagation of DVT despite  supra-therapeutic anticoagulation.   HPI: Willie Charles is an 20 y.o. male with benign past medical history on no previous medication admitted recently with left lower extremity DVT. He had catheter directed thrombolysis. Despite being compliant with Lovenox, Coumadin, and aspirin with INR of 5.6, there has been propagation of his clot in his left lower extremity. 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 Interventional radiology has partially performed catheter directed clot lysis, however, do to be elevated INR of 5.6, the procedure was aborted. Hospitalist was asked to assume care for further management of this problem.  Rewiew of Systems:  The patient denies anorexia, fever, weight loss,, vision loss, decreased hearing, hoarseness, chest pain, syncope, dyspnea on exertion, peripheral edema, balance deficits, hemoptysis, abdominal pain, melena, hematochezia, severe indigestion/heartburn, hematuria, incontinence, genital sores, muscle weakness, suspicious skin lesions, transient blindness, difficulty walking, depression, unusual weight change, abnormal bleeding, enlarged lymph nodes, angioedema, and breast masses.    Past Medical History  Diagnosis Date  . No pertinent past medical history   . DVT (deep venous thrombosis)     Past Surgical History  Procedure Date  . Mouth surgery   . No past surgeries     Medications:  HOME MEDS: Prior to Admission medications   Medication Sig Start Date End Date Taking? Authorizing Provider  aspirin 81 MG chewable tablet Chew 1 tablet (81 mg total) by mouth daily. 08/04/11 08/03/12 Yes Belkys Regalado, MD  cyclobenzaprine (FLEXERIL) 5 MG tablet Take 1 tablet (5 mg total) by mouth 3 (three) times daily as needed for muscle spasms. 08/04/11  Yes Belkys Regalado, MD  enoxaparin  (LOVENOX) 80 MG/0.8ML SOLN injection Inject 0.8 mLs (80 mg total) into the skin daily. 08/04/11  Yes Hartley Barefoot, MD  PRESCRIPTION MEDICATION Take 1 tablet by mouth daily as needed. For pain Medication is a prescribed pain medication.    Yes Historical Provider, MD  warfarin (COUMADIN) 5 MG tablet Take 5 mg by mouth daily at 6 PM.   08/04/11 08/03/12 Yes Belkys Regalado, MD     Allergies:  No Known Allergies  Social History:   reports that he has never smoked. He has never used smokeless tobacco. He reports that he drinks alcohol. He reports that he does not use illicit drugs.  Family History: History reviewed. No pertinent family history.   Physical Exam: Filed Vitals:   08/08/11 1620 08/08/11 1713 08/08/11 2100 08/09/11 0100  BP:  108/63 102/62 105/68  Pulse: 106 91 85   Temp:  101.6 F (38.7 C) 98.4 F (36.9 C)   TempSrc:  Oral Oral   Resp: 18 18 18    Height:  6' (1.829 m)    Weight:  56.4 kg (124 lb 5.4 oz)    SpO2: 96% 98% 95%    Blood pressure 105/68, pulse 85, temperature 98.4 F (36.9 C), temperature source Oral, resp. rate 18, height 6' (1.829 m), weight 56.4 kg (124 lb 5.4 oz), SpO2 95.00%.  GEN:  Pleasant  person lying in the stretcher in no acute distress; cooperative with exam PSYCH:  alert and oriented x4; does not appear anxious does not appear depressed; affect is normal HEENT: Mucous membranes pink and anicteric; PERRLA; EOM intact; no cervical lymphadenopathy nor thyromegaly or carotid bruit; no JVD; Breasts:: Not examined CHEST WALL: No tenderness CHEST: Normal respiration, clear to auscultation bilaterally HEART: Regular rate  and rhythm; no murmurs rubs or gallops BACK: No kyphosis or scoliosis; no CVA tenderness ABDOMEN: Obese, soft non-tender; no masses, no organomegaly, normal abdominal bowel sounds; no pannus; no intertriginous candida. Rectal Exam: Not done EXTREMITIES: No bone or joint deformity; age-appropriate arthropathy of the hands and  knees; there is slight edema and slight tenderness of his lower extremity. There is slight erythema of the left foot as well. Genitalia: not examined PULSES: 2+ and symmetric SKIN: Normal hydration no rash or ulceration CNS: Cranial nerves 2-12 grossly intact no focal neurologic deficit   Labs & Imaging Results for orders placed during the hospital encounter of 08/07/11 (from the past 48 hour(s))  PROTIME-INR     Status: Abnormal   Collection Time   08/07/11  1:11 PM      Component Value Range Comment   Prothrombin Time 53.1 (*) 11.6 - 15.2 (seconds)    INR 5.82 (*) 0.00 - 1.49    CBC     Status: Abnormal   Collection Time   08/07/11  2:41 PM      Component Value Range Comment   WBC 8.9  4.0 - 10.5 (K/uL)    RBC 4.63  4.22 - 5.81 (MIL/uL)    Hemoglobin 13.6  13.0 - 17.0 (g/dL)    HCT 04.5  40.9 - 81.1 (%)    MCV 85.3  78.0 - 100.0 (fL)    MCH 29.4  26.0 - 34.0 (pg)    MCHC 34.4  30.0 - 36.0 (g/dL)    RDW 91.4  78.2 - 95.6 (%)    Platelets 437 (*) 150 - 400 (K/uL)   DIFFERENTIAL     Status: Abnormal   Collection Time   08/07/11  2:41 PM      Component Value Range Comment   Neutrophils Relative 74  43 - 77 (%)    Neutro Abs 6.6  1.7 - 7.7 (K/uL)    Lymphocytes Relative 13  12 - 46 (%)    Lymphs Abs 1.2  0.7 - 4.0 (K/uL)    Monocytes Relative 12  3 - 12 (%)    Monocytes Absolute 1.1 (*) 0.1 - 1.0 (K/uL)    Eosinophils Relative 1  0 - 5 (%)    Eosinophils Absolute 0.1  0.0 - 0.7 (K/uL)    Basophils Relative 0  0 - 1 (%)    Basophils Absolute 0.0  0.0 - 0.1 (K/uL)   BASIC METABOLIC PANEL     Status: Abnormal   Collection Time   08/07/11  2:41 PM      Component Value Range Comment   Sodium 134 (*) 135 - 145 (mEq/L)    Potassium 4.5  3.5 - 5.1 (mEq/L)    Chloride 94 (*) 96 - 112 (mEq/L)    CO2 27  19 - 32 (mEq/L)    Glucose, Bld 99  70 - 99 (mg/dL)    BUN 12  6 - 23 (mg/dL)    Creatinine, Ser 2.13  0.50 - 1.35 (mg/dL)    Calcium 9.5  8.4 - 10.5 (mg/dL)    GFR calc non Af  Amer >90  >90 (mL/min)    GFR calc Af Amer >90  >90 (mL/min)   APTT     Status: Abnormal   Collection Time   08/07/11  4:09 PM      Component Value Range Comment   aPTT 61 (*) 24 - 37 (seconds)   FIBRINOGEN     Status: Abnormal   Collection  Time   08/07/11  4:09 PM      Component Value Range Comment   Fibrinogen 598 (*) 204 - 475 (mg/dL)   MRSA PCR SCREENING     Status: Normal   Collection Time   08/07/11  5:48 PM      Component Value Range Comment   MRSA by PCR NEGATIVE  NEGATIVE    FIBRINOGEN     Status: Abnormal   Collection Time   08/07/11 10:15 PM      Component Value Range Comment   Fibrinogen 529 (*) 204 - 475 (mg/dL)   HEPARIN LEVEL (UNFRACTIONATED)     Status: Abnormal   Collection Time   08/08/11  1:25 AM      Component Value Range Comment   Heparin Unfractionated 0.81 (*) 0.30 - 0.70 (IU/mL)   CBC     Status: Abnormal   Collection Time   08/08/11  6:40 AM      Component Value Range Comment   WBC 8.7  4.0 - 10.5 (K/uL)    RBC 4.29  4.22 - 5.81 (MIL/uL)    Hemoglobin 12.3 (*) 13.0 - 17.0 (g/dL)    HCT 16.1 (*) 09.6 - 52.0 (%)    MCV 85.1  78.0 - 100.0 (fL)    MCH 28.7  26.0 - 34.0 (pg)    MCHC 33.7  30.0 - 36.0 (g/dL)    RDW 04.5  40.9 - 81.1 (%)    Platelets 341  150 - 400 (K/uL)   CREATININE, SERUM     Status: Normal   Collection Time   08/08/11  6:40 AM      Component Value Range Comment   Creatinine, Ser 0.70  0.50 - 1.35 (mg/dL)    GFR calc non Af Amer >90  >90 (mL/min)    GFR calc Af Amer >90  >90 (mL/min)   POTASSIUM     Status: Normal   Collection Time   08/08/11  6:40 AM      Component Value Range Comment   Potassium 4.7  3.5 - 5.1 (mEq/L)   FIBRINOGEN     Status: Abnormal   Collection Time   08/08/11  6:40 AM      Component Value Range Comment   Fibrinogen 541 (*) 204 - 475 (mg/dL)   PROTIME-INR     Status: Abnormal   Collection Time   08/08/11  6:40 AM      Component Value Range Comment   Prothrombin Time 53.4 (*) 11.6 - 15.2 (seconds)     INR 5.87 (*) 0.00 - 1.49     Ir Veno/ext/uni Left  08/07/2011  *RADIOLOGY REPORT*  Indication: Recurrent left lower extremity DVT  LEFT LOWER EXTERMITY VENOGRAM AND PLACEMENT OF INFUSION CATHETER ULTRASOUND GUIDANCE FOR VASCULAR ACCESS  Comparisons: Left lower extremity venous lysis, angioplasty and stent placement - 08/03/2011  Intravenous Medications: Fentanyl 75 mcg IV; Versed 2 mg IV  Contrast: 50 ml Omnipaque-300  Total Moderate Sedation Time: 18 minutes  Fluoroscopy Time: 4.7 minutes.  Complications: None immediate  Technique / Findings:  Informed written consent was obtained from the patient and the patient's mother after a discussion of the risks, benefits and alternatives to treatment.  Questions regarding the procedure were encouraged and answered.  A timeout was performed prior to the initiation of the procedure.  The patient was placed prone on the fluoroscopy table and the skin posterior to the left knee was prepped and draped in the usual sterile fashion, and a sterile drape  was applied covering the operative field.  Maximum barrier sterile technique with sterile gowns and gloves were used for the procedure.  Local anesthesia was provided with 1% lidocaine.  Ultrasound scanning demonstrates the popliteal vein is enlarged, filled with echogenic material and noncompressible, compatible with complete occlusive thrombus.  The left above knee popliteal vein was accessed with a micropuncture needle under direct ultrasound guidance.  This allowed for placement of a 6-French vascular sheath. The limited venogram was performed through the side arm of the sheath demonstrating complete occlusion of the popliteal vein.  With the use of a regular glide wire, a Kumpe catheter was advanced through the left femoral, external iliac vein and through the left common iliac vein to the level of the IVC.  Limited venograms were performed in multiple stations confirming occlusive thrombus throughout the left lower  extremity extending from the left popliteal to the common iliac vein stent.  An inferior venacavagram was performed demonstrating wide patency of the IVC.  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  A wire was utilized for measuring purposes and over an Amplatz super stiff wire, a 90 cm/50-cm infusion catheter was advanced with sideports extending from the tip of the vascular sheath to the central aspect of the left common iliac vein stent.  The vascular sheath was sutured in place with an interrupted suture.  A dressing was placed.  The patient tolerated the procedure well without immediate postprocedural complication.  The infusion catheter was connected to [facility] and the infusion was initiated.  Impression:  1.  Extensive occlusive thrombus extending from the level of the left popliteal vein through the central aspect of the previously placed left common iliac vein stent.  The IVC is widely patent.  2. 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  3.  Successful placement of an infusion catheter with sideports extending from the vascular sheath tip to the central aspect of the left common iliac vein stent.  3.  The patient was transferred to ICU for overnight TNKase infusion and will return tomorrow morning for repeat venogram and possible intervention.  Original Report Authenticated By: Waynard Reeds, M.D.   Ir Transcath/rx/inf  08/07/2011  *RADIOLOGY REPORT*  Indication: Recurrent left lower extremity DVT  LEFT LOWER EXTERMITY VENOGRAM AND PLACEMENT OF INFUSION CATHETER ULTRASOUND GUIDANCE FOR VASCULAR ACCESS  Comparisons: Left lower extremity venous lysis, angioplasty and stent placement - 08/03/2011  Intravenous Medications: Fentanyl 75  mcg IV; Versed 2 mg IV  Contrast: 50 ml Omnipaque-300  Total Moderate Sedation Time: 18 minutes  Fluoroscopy Time: 4.7 minutes.  Complications: None immediate  Technique / Findings:  Informed written consent was obtained from the patient and the patient's mother after a discussion of the risks, benefits and alternatives to treatment.  Questions regarding the procedure were encouraged and answered.  A timeout was performed prior to the initiation of the procedure.  The patient was placed prone on the fluoroscopy table and the skin posterior to the left knee was prepped and draped in the usual sterile fashion, and a sterile drape was applied covering the operative field.  Maximum barrier sterile technique with sterile gowns and gloves were used for the procedure.  Local anesthesia was provided with 1% lidocaine.  Ultrasound scanning demonstrates the popliteal  vein is enlarged, filled with echogenic material and noncompressible, compatible with complete occlusive thrombus.  The left above knee popliteal vein was accessed with a micropuncture needle under direct ultrasound guidance.  This allowed for placement of a 6-French vascular sheath. The limited venogram was performed through the side arm of the sheath demonstrating complete occlusion of the popliteal vein.  With the use of a regular glide wire, a Kumpe catheter was advanced through the left femoral, external iliac vein and through the left common iliac vein to the level of the IVC.  Limited venograms were performed in multiple stations confirming occlusive thrombus throughout the left lower extremity extending from the left popliteal to the common iliac vein stent.  An inferior venacavagram was performed demonstrating wide patency of the IVC.  A pullback venogram demonstrates occlusive thrombus beginning at the level of the previously placed left common iliac stent and suggest the presence of a recurrent focal high-grade stenosis of the central aspect of the  left common iliac vein.  A wire was utilized for measuring purposes and over an Amplatz super stiff wire, a 90 cm/50-cm infusion catheter was advanced with sideports extending from the tip of the vascular sheath to the central aspect of the left common iliac vein stent.  The vascular sheath was sutured in place with an interrupted suture.  A dressing was placed.  The patient tolerated the procedure well without immediate postprocedural complication.  The infusion catheter was connected to [facility] and the infusion was initiated.  Impression:  1.  Extensive occlusive thrombus extending from the level of the left popliteal vein through the central aspect of the previously placed left common iliac vein stent.  The IVC is widely patent.  2. 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  3.  Successful placement of an infusion catheter with sideports extending from the vascular sheath tip to the central aspect of the left common iliac vein stent.  3.  The patient was transferred to ICU for overnight TNKase infusion and will return tomorrow morning for repeat venogram and possible intervention.  Original Report Authenticated By: Waynard Reeds, M.D.   Ir Angiogram Follow Up Study  08/08/2011  *RADIOLOGY REPORT*  Clinical history:20 year old with left lower extremity and pelvic deep vein thrombosis.  The patient started thrombolytic therapy on 08/07/2011.  Thrombolytic therapy was stopped earlier in the day due to an elevated INR level.  PROCEDURE(S): ANGIOGRAM THROUGH EXISTING CATHETER.  Physician: Rachelle Hora. Henn, MD  Medications:None  Moderate sedation time:None  Fluoroscopy time: 2.9 minutes  Procedure:The patient was placed prone on the interventional table. The left popliteal region was prepped and draped in a sterile fashion.  Maximal barrier sterile technique was utilized including caps, mask, sterile gowns, sterile gloves, sterile drape, hand  hygiene and skin antiseptic.  Contrast was injected through the infusion catheter.  The inner portion of the infusion catheter was removed and contrast was injected through the infusion catheter as it was pulled back.  Catheter was removed over a Bentson wire.  A 5- [place] Kumpe catheter was advanced in the IVC and a pullback venogram was performed.  Blood could not be aspirated from the popliteal vein sheath.  The catheter and sheath were completely removed with manual compression.  Findings:The IVC is patent.  There is occlusive thrombus throughout the left femoral vein, femoral vein and left iliac veins.  There is a small channel of flow corresponding with the previous infusion catheter.  Complications: None  Impression:Persistent occlusive thrombus throughout the left thigh and left side of the pelvis.  Thrombolytic therapy was stopped due to the elevated INR level.  Original Report Authenticated By: Richarda Overlie, M.D.   Ir US Guide Vasc Access Left  08/07/2011  *RADIOLOGY REPORT*  Indication: Recurrent left lower extremity DVT  LEFT LOWER EXTERMITY VENOGRAM AND PLACEMENT OF INFUSION CATHETER ULTRASOUND GUIDANCE FOR VASCULAR ACCESS  Comparisons: Left lower extremity venous lysis, angioplasty and stent placement - 08/03/2011  Intravenous Medications: Fentanyl 75 mcg IV; Versed 2 mg IV  Contrast: 50 ml Omnipaque-300  Total Moderate Sedation Time: 18 minutes  Fluoroscopy Time: 4.7 minutes.  Complications: None immediate  Technique / Findings:  Informed written consent was obtained from the patient and the patient's mother after a discussion of the risks, benefits and alternatives to treatment.  Questions regarding the procedure were encouraged and answered.  A timeout was performed prior to the initiation of the procedure.  The patient was placed prone on the fluoroscopy table and the skin posterior to the left knee was prepped and draped in the usual sterile fashion, and a sterile drape was applied covering the  operative field.  Maximum barrier sterile technique with sterile gowns and gloves were used for the procedure.  Local anesthesia was provided with 1% lidocaine.  Ultrasound scanning demonstrates the popliteal vein is enlarged, filled with echogenic material and noncompressible, compatible with complete occlusive thrombus.  The left above knee popliteal vein was accessed with a micropuncture needle under direct ultrasound guidance.  This allowed for placement of a 6-French vascular sheath. The limited venogram was performed through the side arm of the sheath demonstrating complete occlusion of the popliteal vein.  With the use of a regular glide wire, a Kumpe catheter was advanced through the left femoral, external iliac vein and through the left common iliac vein to the level of the IVC.  Limited venograms were performed in multiple stations confirming occlusive thrombus throughout the left lower extremity extending from the left popliteal to the common iliac vein stent.  An inferior venacavagram was performed demonstrating wide patency of the IVC.  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  A wire was utilized for measuring purposes and over an Amplatz super stiff wire, a 90 cm/50-cm infusion catheter was advanced with sideports extending from the tip of the vascular sheath to the central aspect of the left common iliac vein stent.  The vascular sheath was sutured in place with an interrupted suture.  A dressing was placed.  The patient tolerated the procedure well without immediate postprocedural complication.  The infusion catheter was connected to [facility] and the infusion was initiated.  Impression:  1.  Extensive occlusive thrombus extending from the level of the left popliteal vein through the central aspect of  the previously placed left common iliac vein stent.  The IVC is widely patent.  2. 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  3.  Successful placement of an infusion catheter with sideports extending from the vascular sheath tip to the central aspect of the left common iliac vein stent.  3.  The patient was transferred to ICU for overnight TNKase infusion and will return tomorrow morning for repeat venogram and possible intervention.  Original Report Authenticated By: Jonny Ruiz  A. Judithann Sheen, M.D.        PLAN: Left leg DVT despite supratherapeutic INR of 5. 6 with concomitant use of Lovenox and aspirin with propagation, status post partial clot lysis. The thrombophilia workup wasn't reliable because he was on anticoagulation, and during  thrombogenesis. He is stable, and pain is adequately controlled, I would recommend not starting IV heparin or Lovenox, pending the return of therapeutic INR. I would also recommend consulting hematology/oncology for further recommendation as to why he has persistent thrombophilic activity. I do not think he has  Type 2 HIT, as his platelet count is elevated, not reduced. [facility] hospitalist will assume care for this nice gentleman. It should be noted that he had this DVT unprovoked, and he has no prior coagulopathy.      Karlee Staff 08/09/2011, 2:35 AM

## 2011-08-09 NOTE — Consult Note (Signed)
This office note has been dictated.

## 2011-08-10 ENCOUNTER — Inpatient Hospital Stay (HOSPITAL_COMMUNITY): Payer: BC Managed Care – PPO

## 2011-08-10 LAB — CBC
HCT: 37.4 % — ABNORMAL LOW (ref 39.0–52.0)
Hemoglobin: 12.5 g/dL — ABNORMAL LOW (ref 13.0–17.0)
MCV: 86 fL (ref 78.0–100.0)
Platelets: 458 10*3/uL — ABNORMAL HIGH (ref 150–400)
RBC: 4.35 MIL/uL (ref 4.22–5.81)
WBC: 6.8 10*3/uL (ref 4.0–10.5)

## 2011-08-10 LAB — ANTITHROMBIN III: AntiThromb III Func: 79 % (ref 75–120)

## 2011-08-10 LAB — HEPARIN LEVEL (UNFRACTIONATED)
Heparin Unfractionated: 0.11 IU/mL — ABNORMAL LOW (ref 0.30–0.70)
Heparin Unfractionated: 0.17 IU/mL — ABNORMAL LOW (ref 0.30–0.70)

## 2011-08-10 LAB — FIBRINOGEN: Fibrinogen: 558 mg/dL — ABNORMAL HIGH (ref 204–475)

## 2011-08-10 LAB — PROTIME-INR: INR: 1.72 — ABNORMAL HIGH (ref 0.00–1.49)

## 2011-08-10 MED ORDER — SODIUM CHLORIDE 0.9 % IJ SOLN
3.0000 mL | INTRAMUSCULAR | Status: DC | PRN
  Administered 2011-08-10: 3 mL via INTRAVENOUS

## 2011-08-10 MED ORDER — SODIUM CHLORIDE 0.9 % IJ SOLN
3.0000 mL | Freq: Two times a day (BID) | INTRAMUSCULAR | Status: DC
  Administered 2011-08-10 (×2): 3 mL via INTRAVENOUS
  Administered 2011-08-11: 22:00:00 via INTRAVENOUS
  Administered 2011-08-11 – 2011-08-15 (×4): 3 mL via INTRAVENOUS

## 2011-08-10 MED ORDER — IOHEXOL 300 MG/ML  SOLN: 100.0000 mL | Freq: Once | INTRAMUSCULAR | Status: AC | PRN

## 2011-08-10 MED ORDER — TENECTEPLASE 50 MG IV KIT
0.2000 mg/h | PACK | INTRAVENOUS | Status: DC
  Filled 2011-08-10: qty 1

## 2011-08-10 MED ORDER — ONDANSETRON HCL 4 MG/2ML IJ SOLN: 4.0000 mg | Freq: Four times a day (QID) | INTRAMUSCULAR | Status: DC | PRN

## 2011-08-10 MED ORDER — SODIUM CHLORIDE 0.9 % IV SOLN: 250.0000 mL | INTRAVENOUS | Status: DC | PRN

## 2011-08-10 MED ORDER — TENECTEPLASE 50 MG IV KIT
0.4000 mg/h | PACK | INTRAVENOUS | Status: DC
  Administered 2011-08-11: 0.4 mg/h via INTRAVENOUS
  Filled 2011-08-10 (×2): qty 1

## 2011-08-10 MED ORDER — MIDAZOLAM HCL 2 MG/2ML IJ SOLN: 1.0000 mg | INTRAMUSCULAR | Status: DC | PRN

## 2011-08-10 MED ORDER — FENTANYL CITRATE 0.05 MG/ML IJ SOLN
INTRAMUSCULAR | Status: DC | PRN
  Administered 2011-08-10: 100 ug via INTRAVENOUS
  Administered 2011-08-10 (×3): 50 ug via INTRAVENOUS

## 2011-08-10 MED ORDER — MIDAZOLAM HCL 5 MG/5ML IJ SOLN
INTRAMUSCULAR | Status: DC | PRN
  Administered 2011-08-10: 2 mg via INTRAVENOUS
  Administered 2011-08-10 (×3): 1 mg via INTRAVENOUS

## 2011-08-10 NOTE — Progress Notes (Addendum)
ANTICOAGULATION CONSULT NOTE - Follow Up Consult  Pharmacy Consult for Heparin Indication: DVT  No Known Allergies  Patient Measurements: Height: 6' (182.9 cm) Weight: 125 lb (56.7 kg) IBW/kg (Calculated) : 77.6  Adjusted Body Weight:   Vital Signs: Temp: 98.3 F (36.8 C) (12/30 0500) BP: 110/71 mmHg (12/30 0500) Pulse Rate: 75  (12/30 0500)  Labs:  Basename 08/10/11 0924 08/10/11 0104 08/09/11 0700 08/08/11 0640 08/08/11 0125 08/07/11 1609 08/07/11 1441  HGB 12.5* -- -- 12.3* -- -- --  HCT 37.4* -- -- 36.5* -- -- 39.5  PLT 458* -- -- 341 -- -- 437*  APTT -- -- -- -- -- 61* --  LABPROT 20.5* -- 50.8* 53.4* -- -- --  INR 1.72* -- 5.51* 5.87* -- -- --  HEPARINUNFRC <0.10* 0.15* -- -- 0.81* -- --  CREATININE -- -- -- 0.70 -- -- 0.72  CKTOTAL -- -- -- -- -- -- --  CKMB -- -- -- -- -- -- --  TROPONINI -- -- -- -- -- -- --   Estimated Creatinine Clearance: 118.1 ml/min (by C-G formula based on Cr of 0.7).   Assessment:  20 yoM with recurrent, severe iliofemoral LLE DVT despite supratherapeutic INR, IVC stent, and ASA, Lovenox, and Coumadin.  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  Current HL subtherapeutic (0.15 decreased to  <0.1) despite increase in heparin rate now running at 2200 units/hr (=38 units/kg/hr).  I verified height and weight with patient.    INR 5.87-->1.72 s/p SQ VitK 10mg  x 1 12/29.    MD plans to restart thrombolytic therapy and continue heparin with lower goal.  Pt just transferred to IR.    Goal of Therapy:  Heparin level 0.2-0.5   Plan:  1. Before increasing heparin level, I will order STAT repeat heparin level and AT3 level, and dose heparin accordingly.  Nurse from IR will draw levels.    Haynes Hoehn, PharmD 08/10/2011 11:06 AM  Pager: [phone]   Update:  Repeat HL= 0.1, Antithrombin3 =79%.  Spoke with Dr. Lowella Dandy and new plan is to leave heparin at current rate, given pt has just  received thrombolytics and rate of heparin is already very high.  Will recheck 8 hour heparin level (anti-Xa level).  Heparin goal for now is 0.1  Haynes Hoehn, PharmD 08/10/2011 1:27 PM  Pager: [phone]

## 2011-08-10 NOTE — Procedures (Signed)
Post-Procedure Note  Pre-operative Diagnosis: left iliofemoral DVT       Post-operative Diagnosis: Same   Indications:  Left iliofemoral DVT   Procedure Details:   US guided placement of catheter in left popliteal vein.  Placement of infusion catheter in left lower extremity DVT.  Tip placed in IVC.  Findings: DVT extending from left popliteal vein to left iliac vein (including stented portion).  Small superfiical veins in calf are patent.  Complications: None     Condition: good  Plan:  Start catheter-directed thrombolytic therapy with tenecteplase and continue heparin.   Transfer to ICU.  Return to IR on 12/31.

## 2011-08-10 NOTE — Consult Note (Signed)
Willie Charles, Willie Charles NO.:  [id number]  MEDICAL RECORD NO.:  [id number]  LOCATION:  3715                         FACILITY:  [facility]  PHYSICIAN:  [name], M.D.  DATE OF BIRTH:  1990-11-17  DATE OF CONSULTATION: DATE OF DISCHARGE:                                CONSULTATION   REFERRING PHYSICIAN:  Gordy Savers, MD.  DIAGNOSES: 1. 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 2. May-Thurner syndrome. 3. Questionable protein C deficiency. 4. Questionable positive lupus anticoagulant.  HISTORY OF PRESENT ILLNESS:  Willie Charles is a really nice 20 year old white gentleman.  He goes to college at [employer] in [place].  He has had no medical problems at all.  He flew back from [place] to [place] couple weeks ago.  He presented acutely on December 21 with swollen left leg.  He subsequently underwent Doppler studies.  The Doppler studies showed an extensive DVT in the left leg.  The Doppler showed that the DVT extended from the left common femoral down to the left posterior tibial vein.  He did have hypercoagulable studies done.  This was done I think while he was on Lovenox/heparin and not yet on Coumadin.  His protein C level was 42%.  The activity level was okay at 80%.  Normal protein S level. His initial INR was 1.08.  He did test positive for lupus anticoagulant. He was negative for factor 5 Leiden mutation.  His anticardiolipin IgA was minimally elevated at 14.  Intervention Radiology worked out him extensively.  They did thrombolytic therapy.  They did mechanical thrombectomy.  They used an AngioJet.  He was found to have the May-Thurner abnormality.  He had high-grade stenosis at the confluence of the left common iliac vein with the IVC.  His leg improved nicely.  He was subsequently discharged on Coumadin 7.5 mg a day and Lovenox 80 mg a day.  Unfortunately, prior to Christmas, his left leg started swelling  again. He underwent another Doppler test.  This showed recurrence of his DVT. He actually was also on low-dose aspirin.  His INR was high at 5.8.  He was admitted.  Radiology has seen him.  They elected to hold off on any thrombolytic studies until his INR "normalized."  He was seen by Dr. Eleonore Chiquito.  Dr. Amador Cunas called [place] to help out with management.  His left leg is swollen and somewhat painful.  He is really not getting out of bed and ambulating.  He has had no cough oir shortness of breath. He has had no abdominal pain.  His appetite is okay.  There has been no bleeding.  He has had no headache.  His CBC on admission showed a white cell count 8.9, hemoglobin 13.6, hematocrit 39.5, platelet count 437.  His electrolytes all appeared unremarkable.  He had a homocystine level that was normal at 8.1.  PAST MEDICAL HISTORY:  Unremarkable.  ALLERGIES:  None.  MEDICATIONS: 1. Heparin drip. 2. Flexeril 5 mg q.8 hours p.r.n. 3. Tylenol as needed. 4. Morphine 2 mg IV q.4 hours p.r.n.  SOCIAL HISTORY:  Negative for tobacco or alcohol use.  He does not  take any kind of supplements.  He is very active at college.  FAMILY HISTORY:  Remarkable for maternal grandmother with clot.  REVIEW OF SYSTEMS:  As stated in history of present illness.  PHYSICAL EXAMINATION:  GENERAL:  This is a thin, well-nourished white gentleman, in no obvious distress. VITAL SIGNS:  Temperature 100, pulse 91, respiratory rate 19, blood pressure 112/64, weight is 124. HEENT:  Head exam shows normocephalic and atraumatic skull.  There are no ocular or oral lesions.  There are no palpable cervical or supraclavicular lymph nodes. LUNGS:  Clear bilaterally. CARDIAC:  Regular rate and rhythm with no S1, S2.  There are no murmurs, rubs, or bruits. ABDOMEN:  Soft.  Good bowel sounds.  There is no palpable abdominal mass.  There is no fluid wave.  There is no palpable hepatosplenomegaly. EXTREMITIES:   Does show swelling of the left leg.  He does have some erythema on the inner aspect of the left thigh.  There may be a little cellulitis in this area.  He has decent range of motion of his left leg. He has good pulses in his dorsalis pedis artery in both extremities. Right leg is unremarkable. NEUROLOGIC:  Shows no focal neurological deficit.  IMPRESSION:  Willie Charles is a 20 year old gentleman with recurrence of a deep venous thrombosis in the left leg.  He does have the May-Thurner abnormality.  He was supratherapeutic on Coumadin, but one must believe that he is Coumadin resistant.  He definitely needs thrombolytics therapy again.  He has had significant risk for postphlebitic syndrome.  He is an active guy.  I want to try to minimize his risk of postphlebitic syndrome.  It is possible that he may have protein C deficiency.  Platelet count I think that the lupus anticoagulant is a false-positive.  Well will, at some point, check a prothrombin 2 gene mutation on him.  I spent a good hour and a half with he and his family.  They were all very nice.  I did speak with Dr. Stephenie Acres Interventional Radiology.  He has seen Willie Charles already.  I will give him some vitamin K.  This hopefully will get his INR down so that we can get him into thrombolytic therapy which is going to be the optimal way of minimizing the long-term complications.  I will put him on some Unasyn for the possibility of some cellulitis and that the left inner thigh.  I just want to make sure that we are aggressive with that.  This is a clearly complicated case.  He is not going to go back on Coumadin after his interventional procedures.  I will put him on Arixtra.  I would then consider him for Xarelto.  I think this is much better anticoagulant than Coumadin.  I do not see any evidence of any kind of underlying malignancy such that we have to do scans on him.     [name], M.D.     PRE/MEDQ  D:   08/09/2011  T:  08/10/2011  Job:  [id number]

## 2011-08-10 NOTE — Progress Notes (Signed)
Subjective: 20 year old patient with extensive left iliofemoral DVT who has been transferred to the unit for thrombolytic therapy via infusion catheter placed in the left popliteal vein.  His INR was near normalized yesterday with vitamin K. He is resting quietly without complaints. Denies any chest pain or shortness of breath. O2 saturations are 100% on room air.  Objective: Vital signs in last 24 hours: Temp:  [98.2 F (36.8 C)-100.9 F (38.3 C)] 98.8 F (37.1 C) (12/30 1553) Pulse Rate:  [75-98] 98  (12/30 1500) Resp:  [9-19] 15  (12/30 1500) BP: (105-125)/(56-71) 114/61 mmHg (12/30 1500) SpO2:  [97 %-100 %] 100 % (12/30 1500) Weight:  [56.7 kg (125 lb)] 125 lb (56.7 kg) (12/30 0500) Weight change: 0.3 kg (10.6 oz) Last BM Date: 08/05/11  Intake/Output from previous day: 12/29 0701 - 12/30 0700 In: 300 [IV Piggyback:300] Out: 3625 [Urine:3625] Intake/Output this shift: Total I/O In: 494 [I.V.:354; IV Piggyback:140] Out: 250 [Urine:250]  General exam- alert comfortable no distress Chest clear to auscultation Cardiovascular exam- rate approximately 90 Abdomen soft and nondistended  Left leg- edema about the same compared to yesterday-  infusion catheter in place     Lab Results:  [facility] 08/10/11 0924 08/08/11 0640  WBC 6.8 8.7  HGB 12.5* 12.3*  HCT 37.4* 36.5*  PLT 458* 341   BMET  Basename 08/08/11 0640  NA --  K 4.7  CL --  CO2 --  GLUCOSE --  BUN --  CREATININE 0.70  CALCIUM --    Studies/Results: Ir Veno/ext/uni Left  08/10/2011  *RADIOLOGY REPORT*  Clinical history:20 year old with history of May-Thurner syndrome and 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  PROCEDURE(S): ULTRASOUND GUIDANCE FOR VASCULAR ACCESS; INITIATION OF TRANSCATHETER VENOUS THROMBOLYSIS; LEFT LOWER EXTREMITY VENOGRAM  Physician: Rachelle Hora. Henn, MD  Medications:Versed 5 mg, Fentanyl 250 mcg. A radiology nurse monitored the patient for moderate sedation.  Moderate  sedation time:27 minutes  Fluoroscopy time: 3.4 minutes  Procedure:The procedure was explained to the patient.  The risks and benefits of the procedure were discussed and the patient's questions were addressed.  Informed consent was obtained from the patient.  The patient was placed prone on the interventional table. The left popliteal region was evaluated with ultrasound.  The popliteal fossa was prepped and draped in a sterile fashion.  Skin was anesthetized 1% lidocaine.  The lower aspect of the popliteal vein was targeted with ultrasound guidance.  21 gauge needle was directed into the thrombosed popliteal vein with ultrasound guidance and a 0.018 wire was advanced into the left femoral vein. Micropuncture dilator set was placed.  Glidewire was advanced into the IVC and a 6-French vascular sheath was placed.  5-French catheter was advanced up the leg and a small amount of contrast was injected to confirm occlusion of the vein.  The catheter was advanced into the IVC and an IVC venogram was performed.  The 5- French catheter was exchanged for a 50 cm infusion catheter. The sheath was sutured to the skin.  Tenecteplase started at 0.4 mg an hour through the infusion catheter.  Findings:Thrombosis of the popliteal vein by ultrasound.  Thrombus extends from the popliteal vein to the left iliac veins.  The stented portion of the left common iliac vein is occluded.  The IVC is patent.  Infusion catheter was positioned from the IVC to the left femoral vein.  Complications: None  Impression: Placement of an infusion catheter within the left pelvic and left lower extremity veins.  Initiation of catheter-  directed thrombolysis for deep vein thrombosis.  Original Report Authenticated By: Richarda Overlie, M.D.   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  PROCEDURE(S): ULTRASOUND GUIDANCE FOR  VASCULAR ACCESS; INITIATION OF TRANSCATHETER VENOUS THROMBOLYSIS; LEFT LOWER EXTREMITY VENOGRAM  Physician: Rachelle Hora. Henn, MD  Medications:Versed 5 mg, Fentanyl 250 mcg. A radiology nurse monitored the patient for moderate sedation.  Moderate sedation time:27 minutes  Fluoroscopy time: 3.4 minutes  Procedure:The procedure was explained to the patient.  The risks and benefits of the procedure were discussed and the patient's questions were addressed.  Informed consent was obtained from the patient.  The patient was placed prone on the interventional table. The left popliteal region was evaluated with ultrasound.  The popliteal fossa was prepped and draped in a sterile fashion.  Skin was anesthetized 1% lidocaine.  The lower aspect of the popliteal vein was targeted with ultrasound guidance.  21 gauge needle was directed into the thrombosed popliteal vein with ultrasound guidance and a 0.018 wire was advanced into the left femoral vein. Micropuncture dilator set was placed.  Glidewire was advanced into the IVC and a 6-French vascular sheath was placed.  5-French catheter was advanced up the leg and a small amount of contrast was injected to confirm occlusion of the vein.  The catheter was advanced into the IVC and an IVC venogram was performed.  The 5- French catheter was exchanged for a 50 cm infusion catheter. The sheath was sutured to the skin.  Tenecteplase started at 0.4 mg an hour through the infusion catheter.  Findings:Thrombosis of the popliteal vein by ultrasound.  Thrombus extends from the popliteal vein to the left iliac veins.  The stented portion of the left common iliac vein is occluded.  The IVC is patent.  Infusion catheter was positioned from the IVC to the left femoral vein.  Complications: None  Impression: Placement of an infusion catheter within the left pelvic and left lower extremity veins.  Initiation of catheter- directed thrombolysis for deep vein thrombosis.  Original Report Authenticated  By: Richarda Overlie, M.D.   Ir US Guide Vasc Access Left  08/10/2011  *RADIOLOGY REPORT*  Clinical history:21 year old with history of May-Thurner syndrome and 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  PROCEDURE(S): ULTRASOUND GUIDANCE FOR VASCULAR ACCESS; INITIATION OF TRANSCATHETER VENOUS THROMBOLYSIS; LEFT LOWER EXTREMITY VENOGRAM  Physician: Rachelle Hora. Henn, MD  Medications:Versed 5 mg, Fentanyl 250 mcg. A radiology nurse monitored the patient for moderate sedation.  Moderate sedation time:27 minutes  Fluoroscopy time: 3.4 minutes  Procedure:The procedure was explained to the patient.  The risks and benefits of the procedure were discussed and the patient's questions were addressed.  Informed consent was obtained from the patient.  The patient was placed prone on the interventional table. The left popliteal region was evaluated with ultrasound.  The popliteal fossa was prepped and draped in a sterile fashion.  Skin was anesthetized 1% lidocaine.  The lower aspect of the popliteal vein was targeted with ultrasound guidance.  21 gauge needle was directed into the thrombosed popliteal vein with ultrasound guidance and a 0.018 wire was advanced into the left femoral vein. Micropuncture dilator set was placed.  Glidewire was advanced into the IVC and a 6-French vascular sheath was placed.  5-French catheter was advanced up the leg and a small amount of contrast was injected to confirm occlusion of the vein.  The catheter was advanced into the  IVC and an IVC venogram was performed.  The 5- French catheter was exchanged for a 50 cm infusion catheter. The sheath was sutured to the skin.  Tenecteplase started at 0.4 mg an hour through the infusion catheter.  Findings:Thrombosis of the popliteal vein by ultrasound.  Thrombus extends from the popliteal vein to the left iliac veins.  The stented portion of the left common iliac vein is occluded.  The IVC is patent.  Infusion catheter was positioned from the IVC to the  left femoral vein.  Complications: None  Impression: Placement of an infusion catheter within the left pelvic and left lower extremity veins.  Initiation of catheter- directed thrombolysis for deep vein thrombosis.  Original Report Authenticated By: Richarda Overlie, M.D.    Medications: I have reviewed the patient's current medications.  Assessment/Plan:  Extensive left iliofemoral and left lower extremity DVT.  Following  Intra catheter thrombolytic therapy, will place on factor Xa inhibition  with Arixtra  initially followed by Xarelto as outpatient per Hematology recommendation  LOS: 3 days   Rogelia Boga 08/10/2011, 4:23 PM

## 2011-08-10 NOTE — Progress Notes (Signed)
This office note has been dictated.

## 2011-08-10 NOTE — Progress Notes (Signed)
Subjective: Left leg feels a little better, minimal change.    Objective: Vital signs in last 24 hours: Temp:  [98.3 F (36.8 C)-100.9 F (38.3 C)] 98.3 F (36.8 C) (12/30 0500) Pulse Rate:  [75-91] 75  (12/30 0500) Resp:  [18-19] 18  (12/30 0500) BP: (101-112)/(62-71) 110/71 mmHg (12/30 0500) SpO2:  [96 %-99 %] 98 % (12/30 0500) Weight:  [125 lb (56.7 kg)] 125 lb (56.7 kg) (12/30 0500) Last BM Date: 08/05/11  Intake/Output from previous day: 12/29 0701 - 12/30 0700 In: 300 [IV Piggyback:300] Out: 3625 [Urine:3625] Intake/Output this shift:    PE:  Chest:  Clear; Heart: RRR; Left leg:  Persistent left foot and calf swelling but thigh is less tense Lab Results:   [facility] 08/10/11 0924 08/08/11 0640  WBC 6.8 8.7  HGB 12.5* 12.3*  HCT 37.4* 36.5*  PLT 458* 341   BMET  Basename 08/08/11 0640 08/07/11 1441  NA -- 134*  K 4.7 4.5  CL -- 94*  CO2 -- 27  GLUCOSE -- 99  BUN -- 12  CREATININE 0.70 0.72  CALCIUM -- 9.5   PT/INR  Basename 08/10/11 0924 08/09/11 0700  LABPROT 20.5* 50.8*  INR 1.72* 5.51*   ABG No results found for this basename: PHART:2,PCO2:2,PO2:2,HCO3:2 in the last 72 hours  Studies/Results: Ir Angiogram Follow Up Study  08/08/2011  *RADIOLOGY REPORT*  Clinical history:20 year old with left lower extremity and pelvic deep vein thrombosis.  The patient started thrombolytic therapy on 08/07/2011.  Thrombolytic therapy was stopped earlier in the day due to an elevated INR level.  PROCEDURE(S): ANGIOGRAM THROUGH EXISTING CATHETER.  Physician: Rachelle Hora. Alantra Popoca, MD  Medications:None  Moderate sedation time:None  Fluoroscopy time: 2.9 minutes  Procedure:The patient was placed prone on the interventional table. The left popliteal region was prepped and draped in a sterile fashion.  Maximal barrier sterile technique was utilized including caps, mask, sterile gowns, sterile gloves, sterile drape, hand hygiene and skin antiseptic.  Contrast was injected through  the infusion catheter.  The inner portion of the infusion catheter was removed and contrast was injected through the infusion catheter as it was pulled back.  Catheter was removed over a Bentson wire.  A 5- [place] Kumpe catheter was advanced in the IVC and a pullback venogram was performed.  Blood could not be aspirated from the popliteal vein sheath.  The catheter and sheath were completely removed with manual compression.  Findings:The IVC is patent.  There is occlusive thrombus throughout the left femoral vein, femoral vein and left iliac veins.  There is a small channel of flow corresponding with the previous infusion catheter.  Complications: None  Impression:Persistent occlusive thrombus throughout the left thigh and left side of the pelvis.  Thrombolytic therapy was stopped due to the elevated INR level.  Original Report Authenticated By: Richarda Overlie, M.D.    Anti-infectives: Anti-infectives     Start     Dose/Rate Route Frequency Ordered Stop   08/09/11 1900   Ampicillin-Sulbactam (UNASYN) 3 g in sodium chloride 0.9 % 100 mL IVPB        3 g 100 mL/hr over 60 Minutes Intravenous 4 times per day 08/09/11 1826            Assessment/Plan: s/p left leg thrombolysis 2 days ago which was prematurely stopped due to abnormal INR level.  The INR level is 1.72 after Vitamin K.  Patient is high risk for post thrombotic syndrome due to iliofemoral DVT.  He would benefit from a complete  thrombolytic therapy.  Plan to start catheter-directed thrombolytics in the left leg and pelvis today.  Will need to transfer to ICU during thrombolytic therapy.     LOS: 3 days    Willie Charles 08/10/2011

## 2011-08-10 NOTE — Progress Notes (Signed)
ANTICOAGULATION CONSULT NOTE - Initial Consult  Pharmacy Consult for Heparin Indication: DVT  No Known Allergies  Patient Measurements: Height: 6' (182.9 cm) Weight: 124 lb 5.4 oz (56.4 kg) IBW/kg (Calculated) : 77.6  Heparin Dosing Weight: 56.4 kg  Vital Signs: Temp: 99.5 F (37.5 C) (12/29 2317) BP: 108/70 mmHg (12/29 2000) Pulse Rate: 89  (12/29 2000)  Labs:  Basename 08/10/11 0104 08/09/11 0700 08/08/11 0640 08/08/11 0125 08/07/11 1609 08/07/11 1441 08/07/11 1311  HGB -- -- 12.3* -- -- 13.6 --  HCT -- -- 36.5* -- -- 39.5 --  PLT -- -- 341 -- -- 437* --  APTT -- -- -- -- 61* -- --  LABPROT -- 50.8* 53.4* -- -- -- 53.1*  INR -- 5.51* 5.87* -- -- -- 5.82*  HEPARINUNFRC 0.15* -- -- 0.81* -- -- --  CREATININE -- -- 0.70 -- -- 0.72 --  CKTOTAL -- -- -- -- -- -- --  CKMB -- -- -- -- -- -- --  TROPONINI -- -- -- -- -- -- --   Estimated Creatinine Clearance: 117.5 ml/min (by C-G formula based on Cr of 0.7).  Medical History: Past Medical History  Diagnosis Date  . No pertinent past medical history   . DVT (deep venous thrombosis)     Assessment: 20 yo M with recent admission for DVT s/p thrombolysis and stent placement to resume anticoagulation with Heparin  Goal of Therapy:  Heparin level 0.3-0.7 units/ml   Plan:  Increase Heparin 2200 units/hr Check heparin level in 6 hours.  Geannie Risen, PharmD, BCPS 08/10/2011,3:07 AM

## 2011-08-11 ENCOUNTER — Inpatient Hospital Stay (HOSPITAL_COMMUNITY): Payer: BC Managed Care – PPO

## 2011-08-11 DIAGNOSIS — R509 Fever, unspecified: Secondary | ICD-10-CM | POA: Diagnosis not present

## 2011-08-11 LAB — FIBRINOGEN: Fibrinogen: 392 mg/dL (ref 204–475)

## 2011-08-11 LAB — CBC
HCT: 35.3 % — ABNORMAL LOW (ref 39.0–52.0)
Hemoglobin: 12 g/dL — ABNORMAL LOW (ref 13.0–17.0)
RDW: 12.3 % (ref 11.5–15.5)
WBC: 5.8 10*3/uL (ref 4.0–10.5)

## 2011-08-11 LAB — PROTIME-INR
INR: 1.44 (ref 0.00–1.49)
Prothrombin Time: 17.8 seconds — ABNORMAL HIGH (ref 11.6–15.2)

## 2011-08-11 LAB — HEPARIN LEVEL (UNFRACTIONATED): Heparin Unfractionated: 0.6 IU/mL (ref 0.30–0.70)

## 2011-08-11 LAB — CREATININE, SERUM: GFR calc non Af Amer: >90 mL/min (ref >90)

## 2011-08-11 MED ORDER — TENECTEPLASE 50 MG IV KIT
0.2000 mg/h | PACK | INTRAVENOUS | Status: DC
  Administered 2011-08-11 – 2011-08-12 (×2): 0.2 mg/h via INTRAVENOUS
  Filled 2011-08-11: qty 0.5

## 2011-08-11 MED ORDER — SODIUM CHLORIDE 0.9 % IV SOLN: 0.2000 mg/h | INTRAVENOUS | Status: DC

## 2011-08-11 MED ORDER — IOHEXOL 300 MG/ML  SOLN
50.0000 mL | Freq: Once | INTRAMUSCULAR | Status: AC | PRN
  Administered 2011-08-11: 35 mL via INTRAVENOUS

## 2011-08-11 MED ORDER — TENECTEPLASE 50 MG IV KIT
0.2000 mg/h | PACK | INTRAVENOUS | Status: DC
  Administered 2011-08-11 – 2011-08-12 (×2): 0.2 mg/h via INTRAVENOUS
  Filled 2011-08-11 (×3): qty 0.5

## 2011-08-11 NOTE — Progress Notes (Signed)
This office note has been dictated.

## 2011-08-11 NOTE — Progress Notes (Signed)
Mr. Leicht is now in the surgical ICU.  His INR normalized yesterday. Since that, interventional radiology could work on his left lower extremity DVT.  He currently is receiving his tenecteplase.  He is also on heparin.  His left leg does not look as swollen.  When he had his repeat venogram done, he had thrombosis of the popliteal vein that extended up into the left iliac vein.  The stented portion of the left common iliac vein was occluded.  The IVC was patent.  He is feeling okay.  There is a little bit of soreness in his left leg.  There has been no bleeding.  He has had no cough.  His lab studies today show white cell count 5.8, hemoglobin 12, hematocrit 35, platelet count 340.  PHYSICAL EXAM:  This is a thin, but well-nourished white gentleman in no obvious distress.  Vital signs:  98.6, pulse 76, respiratory rate 16, blood pressure 116/65, oxygen saturation on room air is 96%.  Head and neck:  No ocular or oral lesions.  There are no palpable cervical or supraclavicular lymph nodes.  Lungs:  Clear bilaterally.  Cardiac: Regular rate and rhythm with normal S1, S2.  There are no murmurs, rubs, or bruits.  Abdomen:  Soft with good bowel sounds.  There is no palpable abdominal mass.  There is no fluid wave.  No palpable hepatosplenomegaly.  Extremities:  Still show some pitting edema of the left leg.  It is not as edematous as when I initially saw him on the 29th.  There is no erythema on the inner upper left thigh.  He has good pulses in his feet.  Mr. Caraher has recurrence of his left lower extremity DVT.  He has May- Thurner abnormality.  He could have a protein-C deficiency.  He also could have lupus anticoagulant.  These tests were both abnormal when he initially presented on the 21st.  I am more suspicious of him having protein C deficiency.  We will continue to follow along.  I would plan on continuing heparin on him for least 4-5 days as an inpatient once his  vascular procedures and thrombolytic therapy is complete.  I just feel that he needs very aggressive anticoagulation after thrombolytic therapy.  I feel confident in using heparin.  Once he completes that 4-5 days of heparin as an inpatient, then I will consider putting him on Arixtra as an outpatient.  Then I would convert him over to Xarelto.  This is also a complicated case.  I appreciate everybody's help with respect to Mr. Hammen.  I do not want to see him end up with a post phlebitic issue with his left leg.  He is only 20 years old, and again aggressive anticoagulation is indicated. He also will need to have a measured compression stocking for his left leg once he completes his thrombolytic therapy.    ______________________________ [name], M.D. PRE/MEDQ  D:  08/11/2011  T:  08/11/2011  Job:  [id number]

## 2011-08-11 NOTE — Progress Notes (Signed)
Pt. Came back from IR with TNK infusing to infusion wire and to sheath (0.2mg /hour each site), total drug amount to equal 0.4mg /hour, per MD order. Site clean dry intact. Will continue to monitor.

## 2011-08-11 NOTE — Progress Notes (Signed)
ANTICOAGULATION CONSULT NOTE - Follow Up Consult  Pharmacy Consult for Heparin Indication: DVT  No Known Allergies  Patient Measurements: Height: 6' (182.9 cm) Weight: 125 lb (56.7 kg) IBW/kg (Calculated) : 77.6    Vital Signs: Temp: 98.4 F (36.9 C) (12/30 2337) Temp src: Oral (12/30 2337) BP: 112/60 mmHg (12/31 0100) Pulse Rate: 74  (12/31 0100)  Labs:  Basename 08/10/11 2207 08/10/11 1153 08/10/11 0924 08/09/11 0700 08/08/11 0640  HGB -- -- 12.5* -- 12.3*  HCT -- -- 37.4* -- 36.5*  PLT -- -- 458* -- 341  APTT -- -- -- -- --  LABPROT -- -- 20.5* 50.8* 53.4*  INR -- -- 1.72* 5.51* 5.87*  HEPARINUNFRC 0.17* 0.11* <0.10* -- --  CREATININE -- -- -- -- 0.70  CKTOTAL -- -- -- -- --  CKMB -- -- -- -- --  TROPONINI -- -- -- -- --   Estimated Creatinine Clearance: 118.1 ml/min (by C-G formula based on Cr of 0.7).   Assessment:   20 yoM with recurrent, severe iliofemoral LLE DVT despite supratherapeutic INR, IVC stent, and ASA, Lovenox, and Coumadin.  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 Current HL subtherapeutic (0.17) with new goal of 0.2-0.5.  Thrombolytics resumed today.  Goal of Therapy:  Heparin level 0.2-0.5   Plan:  Will increase rate slightly more to 2400 units/hr.  F/U levels and adjust accordingly.   Nadara Mustard, PharmD., MS Clinical Pharmacist Pager [phone] 08-11-2011

## 2011-08-11 NOTE — ED Notes (Signed)
Patient denies pain, TNK infusion to continue IV via catheter.

## 2011-08-11 NOTE — Progress Notes (Signed)
Mr. Smola now is on heparin.  Pharmacy is managing this for [place].  I very much appreciate this.  He also started Unasyn.  The erythema on the upper inner left thigh is much better.  He has had no problems with bleeding.  There is still some discomfort in the left leg.  Vitamin K yesterday.  The PT/INR is not yet back.  When he was admitted initially back on 08/01/2011, his protein C level was on the low side.  He has had no cough or shortness of breath.  All of his vital signs are stable. His blood pressure is 110/71.  His pulse is 75.  Temperature is 98.3.  On exam, his left leg is still somewhat swollen.  He has good pulses in his distal extremities.  Again, there is very little if any erythema noted in the upper left inner thigh.  There is no abdominal finding. There is no hepatosplenomegaly.  Hopefully, he will start his thrombolytic therapy today.  The vitamin K that he got yesterday should get his INR down enough to allow for thrombolytic therapy to be started.  We will continue the Unasyn for right now.  We will continue to follow Mr. Tillis along.    ______________________________ [name], M.D. PRE/MEDQ  D:  08/10/2011  T:  08/10/2011  Job:  [id number]

## 2011-08-11 NOTE — Progress Notes (Signed)
Patient ID: Willie Charles, male   DOB: 23-Sep-1990, 20 y.o.   MRN: [id number] Subjective: No events overnight.   Objective:  Vital signs in last 24 hours:  Filed Vitals:   08/11/11 1355 08/11/11 1400 08/11/11 1405 08/11/11 1551  BP:  107/54 101/58   Pulse:      Temp:    99.1 F (37.3 C)  TempSrc:    Oral  Resp: 19 20 18    Height:      Weight:      SpO2:        Intake/Output from previous day:   Intake/Output Summary (Last 24 hours) at 08/11/11 1558 Last data filed at 08/11/11 1300  Gross per 24 hour  Intake 5056.4 ml  Output   3340 ml  Net 1716.4 ml    Physical Exam: General: Alert, awake, oriented x3, in no acute distress. HEENT: No bruits, no goiter. Moist mucous membranes, no scleral icterus, no conjunctival pallor. Heart: Regular rate and rhythm, S1/S2 +, no murmurs, rubs, gallops. Lungs: Clear to auscultation bilaterally. No wheezing, no rhonchi, no rales.  Abdomen: Soft, nontender, nondistended, positive bowel sounds. Extremities: left lower extremity swelling, no erythema Neuro: Grossly nonfocal.  Lab Results:  Basic Metabolic Panel:    Component Value Date/Time   NA 134* 08/07/2011 1441   K 4.0 08/11/2011 0520   CL 94* 08/07/2011 1441   CO2 27 08/07/2011 1441   BUN 12 08/07/2011 1441   CREATININE 0.64 08/11/2011 0520   GLUCOSE 99 08/07/2011 1441   CALCIUM 9.5 08/07/2011 1441   CBC:    Component Value Date/Time   WBC 5.8 08/11/2011 0520   HGB 12.0* 08/11/2011 0520   HCT 35.3* 08/11/2011 0520   PLT 340 08/11/2011 0520   MCV 84.7 08/11/2011 0520   NEUTROABS 6.6 08/07/2011 1441   LYMPHSABS 1.2 08/07/2011 1441   MONOABS 1.1* 08/07/2011 1441   EOSABS 0.1 08/07/2011 1441   BASOSABS 0.0 08/07/2011 1441      Lab 08/11/11 0520 08/10/11 0924 08/08/11 0640 08/07/11 1441  WBC 5.8 6.8 8.7 8.9  HGB 12.0* 12.5* 12.3* 13.6  HCT 35.3* 37.4* 36.5* 39.5  PLT 340 458* 341 437*  MCV 84.7 86.0 85.1 85.3  MCH 28.8 28.7 28.7 29.4  MCHC 34.0 33.4 33.7 34.4    RDW 12.3 12.2 12.5 12.3  LYMPHSABS -- -- -- 1.2  MONOABS -- -- -- 1.1*  EOSABS -- -- -- 0.1  BASOSABS -- -- -- 0.0  BANDABS -- -- -- --    Lab 08/11/11 0520 08/08/11 0640 08/07/11 1441  NA -- -- 134*  K 4.0 4.7 4.5  CL -- -- 94*  CO2 -- -- 27  GLUCOSE -- -- 99  BUN -- -- 12  CREATININE 0.64 0.70 0.72  CALCIUM -- -- 9.5  MG -- -- --    Lab 08/11/11 0520 08/10/11 0924 08/09/11 0700 08/08/11 0640 08/07/11 1311  INR 1.44 1.72* 5.51* 5.87* 5.82*  PROTIME -- -- -- -- --   Cardiac markers: No results found for this basename: CK:3,CKMB:3,TROPONINI:3,MYOGLOBIN:3 in the last 168 hours No components found with this basename: POCBNP:3 Recent Results (from the past 240 hour(s))  MRSA PCR SCREENING     Status: Normal   Collection Time   08/07/11  5:48 PM      Component Value Range Status Comment   MRSA by PCR NEGATIVE  NEGATIVE  Final     Studies/Results: Ir Veno/ext/uni Left  08/10/2011  *RADIOLOGY REPORT*  Clinical history:20 year old with 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  PROCEDURE(S): ULTRASOUND GUIDANCE FOR VASCULAR ACCESS; INITIATION OF TRANSCATHETER VENOUS THROMBOLYSIS; LEFT LOWER EXTREMITY VENOGRAM  Physician: Rachelle Hora. Henn, MD  Medications:Versed 5 mg, Fentanyl 250 mcg. A radiology nurse monitored the patient for moderate sedation.  Moderate sedation time:27 minutes  Fluoroscopy time: 3.4 minutes  Procedure:The procedure was explained to the patient.  The risks and benefits of the procedure were discussed and the patient's questions were addressed.  Informed consent was obtained from the patient.  The patient was placed prone on the interventional table. The left popliteal region was evaluated with ultrasound.  The popliteal fossa was prepped and draped in a sterile fashion.  Skin was anesthetized 1% lidocaine.  The lower aspect of the popliteal vein was targeted with ultrasound guidance.  21 gauge needle was directed into the  thrombosed popliteal vein with ultrasound guidance and a 0.018 wire was advanced into the left femoral vein. Micropuncture dilator set was placed.  Glidewire was advanced into the IVC and a 6-French vascular sheath was placed.  5-French catheter was advanced up the leg and a small amount of contrast was injected to confirm occlusion of the vein.  The catheter was advanced into the IVC and an IVC venogram was performed.  The 5- French catheter was exchanged for a 50 cm infusion catheter. The sheath was sutured to the skin.  Tenecteplase started at 0.4 mg an hour through the infusion catheter.  Findings:Thrombosis of the popliteal vein by ultrasound.  Thrombus extends from the popliteal vein to the left iliac veins.  The stented portion of the left common iliac vein is occluded.  The IVC is patent.  Infusion catheter was positioned from the IVC to the left femoral vein.  Complications: None  Impression: Placement of an infusion catheter within the left pelvic and left lower extremity veins.  Initiation of catheter- directed thrombolysis for deep vein thrombosis.  Original Report Authenticated By: Richarda Overlie, M.D.   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  PROCEDURE(S): ULTRASOUND GUIDANCE FOR VASCULAR ACCESS; INITIATION OF TRANSCATHETER VENOUS THROMBOLYSIS; LEFT LOWER EXTREMITY VENOGRAM  Physician: Rachelle Hora. Henn, MD  Medications:Versed 5 mg, Fentanyl 250 mcg. A radiology nurse monitored the patient for moderate sedation.  Moderate sedation time:27 minutes  Fluoroscopy time: 3.4 minutes  Procedure:The procedure was explained to the patient.  The risks and benefits of the procedure were discussed and the patient's questions were addressed.  Informed consent was obtained from the patient.  The patient was placed prone on the interventional table. The left popliteal region was evaluated  with ultrasound.  The popliteal fossa was prepped and draped in a sterile fashion.  Skin was anesthetized 1% lidocaine.  The lower aspect of the popliteal vein was targeted with ultrasound guidance.  21 gauge needle was directed into the thrombosed popliteal vein with ultrasound guidance and a 0.018 wire was advanced into the left femoral vein. Micropuncture dilator set was placed.  Glidewire was advanced into the IVC and a 6-French vascular sheath was placed.  5-French catheter was advanced up the leg and a small amount of contrast was injected to confirm occlusion of the vein.  The catheter was advanced into the IVC and an IVC venogram was performed.  The 5- French catheter was exchanged for a 50 cm infusion catheter. The sheath was sutured to the skin.  Tenecteplase started at 0.4 mg an  hour through the infusion catheter.  Findings:Thrombosis of the popliteal vein by ultrasound.  Thrombus extends from the popliteal vein to the left iliac veins.  The stented portion of the left common iliac vein is occluded.  The IVC is patent.  Infusion catheter was positioned from the IVC to the left femoral vein.  Complications: None  Impression: Placement of an infusion catheter within the left pelvic and left lower extremity veins.  Initiation of catheter- directed thrombolysis for deep vein thrombosis.  Original Report Authenticated By: Richarda Overlie, M.D.   Ir Angiogram Follow Up Study  08/11/2011  *RADIOLOGY REPORT*  Clinical Data/Indication: 24 HOUR LEFT LOWER EXTREMITY VENOUS LYSIS CHECK  FOLLOW-UP VENOGRAM  Contrast Volume: 40 ml Omnipaque-300.  Fluoroscopy Time: 0.8 minutes.  Procedure: The procedure, risks, benefits, and alternatives were explained to the patient. Questions regarding the procedure were encouraged and answered. The patient understands and consents to the procedure.  Contrast was injected into the infusion catheter and popliteal vascular sheath for venography.  Connected place was then restarted after  the procedure this plate between the catheter and popliteal venous sheath.  Findings: There is improvement in the extensive DVT throughout the left lower extremity and iliac venous system.  Antegrade flow is established through a channel of moderate residual thrombus burden within the thigh and iliac venous system.  Overall there has been improvement.  Complications: None.  IMPRESSION: There has been significant improvement after 24 hours lysis with moderate residual thrombus burden.  Lysis will be continued, split between the popliteal venous sheath and infusion catheter at a total rate of 0.4 mg per hour.  Original Report Authenticated By: Donavan Burnet, M.D.   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  PROCEDURE(S): ULTRASOUND GUIDANCE FOR VASCULAR ACCESS; INITIATION OF TRANSCATHETER VENOUS THROMBOLYSIS; LEFT LOWER EXTREMITY VENOGRAM  Physician: Rachelle Hora. Henn, MD  Medications:Versed 5 mg, Fentanyl 250 mcg. A radiology nurse monitored the patient for moderate sedation.  Moderate sedation time:27 minutes  Fluoroscopy time: 3.4 minutes  Procedure:The procedure was explained to the patient.  The risks and benefits of the procedure were discussed and the patient's questions were addressed.  Informed consent was obtained from the patient.  The patient was placed prone on the interventional table. The left popliteal region was evaluated with ultrasound.  The popliteal fossa was prepped and draped in a sterile fashion.  Skin was anesthetized 1% lidocaine.  The lower aspect of the popliteal vein was targeted with ultrasound guidance.  21 gauge needle was directed into the thrombosed popliteal vein with ultrasound guidance and a 0.018 wire was advanced into the left femoral vein. Micropuncture dilator set was placed.  Glidewire was advanced into the IVC and a 6-French vascular  sheath was placed.  5-French catheter was advanced up the leg and a small amount of contrast was injected to confirm occlusion of the vein.  The catheter was advanced into the IVC and an IVC venogram was performed.  The 5- French catheter was exchanged for a 50 cm infusion catheter. The sheath was sutured to the skin.  Tenecteplase started at 0.4 mg an hour through the infusion catheter.  Findings:Thrombosis of the popliteal vein by ultrasound.  Thrombus extends from the popliteal vein to the left iliac veins.  The stented portion of the left common iliac vein is occluded.  The IVC is patent.  Infusion catheter was positioned from  the IVC to the left femoral vein.  Complications: None  Impression: Placement of an infusion catheter within the left pelvic and left lower extremity veins.  Initiation of catheter- directed thrombolysis for deep vein thrombosis.  Original Report Authenticated By: Richarda Overlie, M.D.    Medications: Scheduled Meds:   . ampicillin-sulbactam (UNASYN) IV  3 g Intravenous Q6H  . sodium chloride  3 mL Intravenous Q12H   Continuous Infusions:   . sodium chloride 125 mL (08/11/11 0930)  . heparin 23 Units (08/11/11 1300)  . tenecteplase (TNKase) infusion 0.2 mg/hr (08/11/11 1515)  . tenecteplase (TNKase) infusion 0.2 mg/hr (08/11/11 1550)  . DISCONTD: tenecteplase (TNKase) infusion    . DISCONTD: tenecteplase (TNKase) infusion 0.4 mg/hr (08/11/11 1300)   PRN Meds:.sodium chloride, acetaminophen, cyclobenzaprine, iohexol, iohexol, midazolam, morphine injection, ondansetron (ZOFRAN) IV, sodium chloride  Assessment/Plan:  Principal Problem:   *Left leg DVT - recurrence of left lower extremity DVT - continue aggressive anticoagulation - hematology is following  Active Problems:   Coagulopathy - perhaps protein C deficiency versus lupus anticoagulant - aggressive anticoagulation for now  Fever - follow up blood culture results - continue Unasyn for now - likely  secondary to DVT   EDUCATION - test results and diagnostic studies were discussed with patient and pt's family who was present at the bedside - patient and family have verbalized the understanding - questions were answered at the bedside and contact information was provided for additional questions or concerns   LOS: 4 days   Jveon Pound 08/11/2011, 3:58 PM  [facility] Pager: [phone]

## 2011-08-11 NOTE — Progress Notes (Signed)
ANTICOAGULATION CONSULT NOTE - Follow Up Consult  Pharmacy Consult for Heparin Indication: DVT  No Known Allergies  Patient Measurements: Height: 6' (182.9 cm) Weight: 125 lb 7.1 oz (56.9 kg) IBW/kg (Calculated) : 77.6    Vital Signs: Temp: 100.5 F (38.1 C) (12/31 0724) Temp src: Oral (12/31 0724) BP: 112/63 mmHg (12/31 0800) Pulse Rate: 84  (12/31 0800)  Labs:  Basename 08/11/11 0520 08/10/11 2207 08/10/11 1153 08/10/11 0924 08/09/11 0700  HGB 12.0* -- -- 12.5* --  HCT 35.3* -- -- 37.4* --  PLT 340 -- -- 458* --  APTT -- -- -- -- --  LABPROT 17.8* -- -- 20.5* 50.8*  INR 1.44 -- -- 1.72* 5.51*  HEPARINUNFRC 0.60 0.17* 0.11* -- --  CREATININE 0.64 -- -- -- --  CKTOTAL -- -- -- -- --  CKMB -- -- -- -- --  TROPONINI -- -- -- -- --   Estimated Creatinine Clearance: 118.5 ml/min (by C-G formula based on Cr of 0.64).   Assessment:   20 yoM with recurrent, severe iliofemoral LLE DVT despite supratherapeutic INR, IVC stent, and ASA, Lovenox, and Coumadin.  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 Current HL supra-therapeutic (0.60) with new goal of 0.2-0.5.  Thrombolytics continue  Goal of Therapy:  Heparin level 0.2-0.5   Plan:  Will decrease rate slightly  to 2300 units/hr.  F/U levels and adjust accordingly.   Thank you.  Okey Regal, PharmD [phone]

## 2011-08-11 NOTE — Procedures (Signed)
24 hr LLE venous lysis check  Improved with moderate residual clot burden.

## 2011-08-12 ENCOUNTER — Inpatient Hospital Stay (HOSPITAL_COMMUNITY): Payer: BC Managed Care – PPO

## 2011-08-12 LAB — PROTIME-INR
INR: 1.39 (ref 0.00–1.49)
Prothrombin Time: 17.3 seconds — ABNORMAL HIGH (ref 11.6–15.2)

## 2011-08-12 LAB — CBC
HCT: 34.8 % — ABNORMAL LOW (ref 39.0–52.0)
Hemoglobin: 11.7 g/dL — ABNORMAL LOW (ref 13.0–17.0)
MCV: 85.1 fL (ref 78.0–100.0)
RBC: 4.09 MIL/uL — ABNORMAL LOW (ref 4.22–5.81)
WBC: 5.7 10*3/uL (ref 4.0–10.5)

## 2011-08-12 LAB — FIBRINOGEN: Fibrinogen: 349 mg/dL (ref 204–475)

## 2011-08-12 MED ORDER — HEPARIN SOD (PORCINE) IN D5W 100 UNIT/ML IV SOLN
2250.0000 [IU]/h | INTRAVENOUS | Status: AC
  Administered 2011-08-13 – 2011-08-14 (×5): 2300 [IU]/h via INTRAVENOUS
  Administered 2011-08-15: 2250 [IU]/h via INTRAVENOUS
  Filled 2011-08-12 (×11): qty 250

## 2011-08-12 MED ORDER — FENTANYL CITRATE 0.05 MG/ML IJ SOLN
INTRAMUSCULAR | Status: DC | PRN
  Administered 2011-08-12 (×3): 25 ug via INTRAVENOUS
  Administered 2011-08-12: 50 ug via INTRAVENOUS
  Administered 2011-08-12: 25 ug via INTRAVENOUS

## 2011-08-12 MED ORDER — MIDAZOLAM HCL 5 MG/5ML IJ SOLN
INTRAMUSCULAR | Status: DC | PRN
  Administered 2011-08-12: 0.5 mg via INTRAVENOUS
  Administered 2011-08-12: 1 mg via INTRAVENOUS
  Administered 2011-08-12: 0.5 mg via INTRAVENOUS

## 2011-08-12 NOTE — Progress Notes (Signed)
Patient ID: Willie Charles, male   DOB: [birth year], 21 y.o.   MRN: [id number] Subjective: No events overnight. Patient denies chest pain, shortness of breath, abdominal pain.  Objective:  Vital signs in last 24 hours:  Filed Vitals:   08/12/11 1215 08/12/11 1230 08/12/11 1245 08/12/11 1300  BP: 108/59 104/57 117/63 106/58  Pulse: 55 51 67 66  Temp:      TempSrc:      Resp: 13 13 16 19   Height:      Weight:      SpO2: 95% 95% 98% 99%    Intake/Output from previous day:   Intake/Output Summary (Last 24 hours) at 08/12/11 1326 Last data filed at 08/12/11 1249  Gross per 24 hour  Intake   4695 ml  Output   2975 ml  Net   1720 ml    Physical Exam: General: Alert, awake, oriented x3, in no acute distress. HEENT: No bruits, no goiter. Moist mucous membranes, no scleral icterus, no conjunctival pallor. Heart: Regular rate and rhythm, S1/S2 +, no murmurs, rubs, gallops. Lungs: Clear to auscultation bilaterally. No wheezing, no rhonchi, no rales.  Abdomen: Soft, nontender, nondistended, positive bowel sounds. Extremities: left lower extremity swelling with slight erythema; pulses palpable Neuro: Grossly nonfocal.  Lab Results:  Basic Metabolic Panel:    Component Value Date/Time   NA 134* 08/07/2011 1441   K 4.0 08/11/2011 0520   CL 94* 08/07/2011 1441   CO2 27 08/07/2011 1441   BUN 12 08/07/2011 1441   CREATININE 0.64 08/11/2011 0520   GLUCOSE 99 08/07/2011 1441   CALCIUM 9.5 08/07/2011 1441   CBC:    Component Value Date/Time   WBC 5.7 08/12/2011 0418   HGB 11.7* 08/12/2011 0418   HCT 34.8* 08/12/2011 0418   PLT 226 08/12/2011 0418   MCV 85.1 08/12/2011 0418   NEUTROABS 6.6 08/07/2011 1441   LYMPHSABS 1.2 08/07/2011 1441   MONOABS 1.1* 08/07/2011 1441   EOSABS 0.1 08/07/2011 1441   BASOSABS 0.0 08/07/2011 1441      Lab 08/12/11 0418 08/11/11 0520 08/10/11 0924 08/08/11 0640 08/07/11 1441  WBC 5.7 5.8 6.8 8.7 8.9  HGB 11.7* 12.0* 12.5* 12.3* 13.6  HCT 34.8* 35.3*  37.4* 36.5* 39.5  PLT 226 340 458* 341 437*  MCV 85.1 84.7 86.0 85.1 85.3  MCH 28.6 28.8 28.7 28.7 29.4  MCHC 33.6 34.0 33.4 33.7 34.4  RDW 12.3 12.3 12.2 12.5 12.3  LYMPHSABS -- -- -- -- 1.2  MONOABS -- -- -- -- 1.1*  EOSABS -- -- -- -- 0.1  BASOSABS -- -- -- -- 0.0  BANDABS -- -- -- -- --    Lab 08/11/11 0520 08/08/11 0640 08/07/11 1441  NA -- -- 134*  K 4.0 4.7 4.5  CL -- -- 94*  CO2 -- -- 27  GLUCOSE -- -- 99  BUN -- -- 12  CREATININE 0.64 0.70 0.72  CALCIUM -- -- 9.5  MG -- -- --    Lab 08/12/11 0418 08/11/11 0520 08/10/11 0924 08/09/11 0700 08/08/11 0640  INR 1.39 1.44 1.72* 5.51* 5.87*  PROTIME -- -- -- -- --   Cardiac markers: No results found for this basename: CK:3,CKMB:3,TROPONINI:3,MYOGLOBIN:3 in the last 168 hours No components found with this basename: POCBNP:3 Recent Results (from the past 240 hour(s))  MRSA PCR SCREENING     Status: Normal   Collection Time   08/07/11  5:48 PM      Component Value Range Status Comment   MRSA by  PCR NEGATIVE  NEGATIVE  Final   CULTURE, BLOOD (ROUTINE X 2)     Status: Normal (Preliminary result)   Collection Time   08/11/11 11:00 AM      Component Value Range Status Comment   Specimen Description BLOOD RIGHT HAND   Final    Special Requests BOTTLES DRAWN AEROBIC AND ANAEROBIC 10CC   Final    Setup Time [id number]   Final    Culture     Final    Value:        BLOOD CULTURE RECEIVED NO GROWTH TO DATE CULTURE WILL BE HELD FOR 5 DAYS BEFORE ISSUING A FINAL NEGATIVE REPORT   Report Status PENDING   Incomplete   CULTURE, BLOOD (ROUTINE X 2)     Status: Normal (Preliminary result)   Collection Time   08/11/11 11:18 AM      Component Value Range Status Comment   Specimen Description BLOOD RIGHT HAND   Final    Special Requests BOTTLES DRAWN AEROBIC AND ANAEROBIC 10CC   Final    Setup Time [id number]   Final    Culture     Final    Value:        BLOOD CULTURE RECEIVED NO GROWTH TO DATE CULTURE WILL BE HELD FOR 5 DAYS  BEFORE ISSUING A FINAL NEGATIVE REPORT   Report Status PENDING   Incomplete     Studies/Results: Ir Angiogram Follow Up Study  08/11/2011  *RADIOLOGY REPORT*  Clinical Data/Indication: 24 HOUR LEFT LOWER EXTREMITY VENOUS LYSIS CHECK  FOLLOW-UP VENOGRAM  Contrast Volume: 40 ml Omnipaque-300.  Fluoroscopy Time: 0.8 minutes.  Procedure: The procedure, risks, benefits, and alternatives were explained to the patient. Questions regarding the procedure were encouraged and answered. The patient understands and consents to the procedure.  Contrast was injected into the infusion catheter and popliteal vascular sheath for venography.  Connected place was then restarted after the procedure this plate between the catheter and popliteal venous sheath.  Findings: There is improvement in the extensive DVT throughout the left lower extremity and iliac venous system.  Antegrade flow is established through a channel of moderate residual thrombus burden within the thigh and iliac venous system.  Overall there has been improvement.  Complications: None.  IMPRESSION: There has been significant improvement after 24 hours lysis with moderate residual thrombus burden.  Lysis will be continued, split between the popliteal venous sheath and infusion catheter at a total rate of 0.4 mg per hour.  Original Report Authenticated By: Donavan Burnet, M.D.    Medications: Scheduled Meds:   . ampicillin-sulbactam (UNASYN) IV  3 g Intravenous Q6H  . sodium chloride  3 mL Intravenous Q12H   Continuous Infusions:   . sodium chloride 125 mL/hr at 08/12/11 1100  . heparin 2,300 Units/hr (08/12/11 1100)  . DISCONTD: heparin 23 mL/hr (08/12/11 0700)  . DISCONTD: tenecteplase (TNKase) infusion 0.2 mg/hr (08/12/11 0335)  . DISCONTD: tenecteplase (TNKase) infusion    . DISCONTD: tenecteplase (TNKase) infusion Stopped (08/12/11 1000)  . DISCONTD: tenecteplase (TNKase) infusion 0.4 mg/hr (08/11/11 1300)   PRN Meds:.sodium chloride,  acetaminophen, cyclobenzaprine, fentaNYL, iohexol, midazolam, midazolam, morphine injection, ondansetron (ZOFRAN) IV, sodium chloride  Assessment/Plan:   Principal Problem:   *Left leg DVT  - recurrence of left lower extremity DVT  - continue aggressive anticoagulation  - hematology is following   Active Problems:   Coagulopathy  - perhaps protein C deficiency versus lupus anticoagulant  - aggressive anticoagulation for now   Fever  -  follow up blood culture results show no growth to date - continue Unasyn for now  - likely secondary to DVT   EDUCATION  - test results and diagnostic studies were discussed with patient and pt's family who was present at the bedside  - patient and family have verbalized the understanding  - questions were answered at the bedside and contact information was provided for additional questions or concerns   LOS: 5 days   Willie Charles 08/12/2011, 1:26 PM  [facility] Pager: [phone]

## 2011-08-12 NOTE — Procedures (Signed)
Procedure:  Follow up venography on thrombolytic therapy, venous mechanical thrombectomy, venous angioplasty and intravascular stent placement. Findings:  Proximal segment of common iliac v stent occluded.  Other venous segments show small amount of residual nonocclusive thrombus.  Mechanical thrombectomy performed w/ Angiojet device.  Angioplasty of CIV stent w/ 12x40 mm balloon.  Stent placed in left CIV, with overlap into previously placed stent.  Angioplasty of other veins in left leg w/ 8x40 mm balloon.  Good flow present throughout upon completion. Plan:  D/C thrombolytic therapy.  Continue IV heparin per Pharmacy.  Bedrest x 3 hr.  Probably OK to transfer out of ICU later today/tomorrow.  Advance diet.

## 2011-08-12 NOTE — Progress Notes (Signed)
Patient to IR for procedure

## 2011-08-12 NOTE — Progress Notes (Signed)
This office note has been dictated. #[id number] is dictation

## 2011-08-12 NOTE — ED Notes (Signed)
Junctional rhythm noted after angio jet BP low.  90's. Fluid bolus given, Dr Evangeline Dakin aware.

## 2011-08-12 NOTE — Progress Notes (Signed)
ANTICOAGULATION CONSULT NOTE - Follow Up Consult  Pharmacy Consult for Heparin Indication: DVT  No Known Allergies  Patient Measurements: Height: 6' (182.9 cm) Weight: 125 lb 7.1 oz (56.9 kg) IBW/kg (Calculated) : 77.6    Vital Signs: Temp: 99.1 F (37.3 C) (01/01 0729) Temp src: Oral (01/01 0729) BP: 122/77 mmHg (01/01 1020) Pulse Rate: 46  (01/01 1025)  Labs:  Basename 08/12/11 0418 08/11/11 0520 08/10/11 2207 08/10/11 0924  HGB 11.7* 12.0* -- --  HCT 34.8* 35.3* -- 37.4*  PLT 226 340 -- 458*  APTT -- -- -- --  LABPROT 17.3* 17.8* -- 20.5*  INR 1.39 1.44 -- 1.72*  HEPARINUNFRC 0.53 0.60 0.17* --  CREATININE -- 0.64 -- --  CKTOTAL -- -- -- --  CKMB -- -- -- --  TROPONINI -- -- -- --   Estimated Creatinine Clearance: 118.5 ml/min (by C-G formula based on Cr of 0.64).   Assessment:   20 yoM with recurrent, severe iliofemoral LLE DVT despite supratherapeutic INR, IVC stent, and ASA, Lovenox, and Coumadin.  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 Current HL slightly supra-therapeutic (0.53) with new goal of 0.2-0.5.  Thrombolytics continue  Goal of Therapy:  Heparin level 0.2-0.5   Plan:  Will decrease rate slightly  to 2250 units/hr and f/u am level  Thank you, Harland German, Pharm D 08/12/2011 10:45 AM

## 2011-08-13 LAB — CBC
Hemoglobin: 11.9 g/dL — ABNORMAL LOW (ref 13.0–17.0)
MCH: 28.8 pg (ref 26.0–34.0)
Platelets: 189 10*3/uL (ref 150–400)
RBC: 4.13 MIL/uL — ABNORMAL LOW (ref 4.22–5.81)

## 2011-08-13 LAB — HEPARIN LEVEL (UNFRACTIONATED): Heparin Unfractionated: 0.63 IU/mL (ref 0.30–0.70)

## 2011-08-13 LAB — PROTIME-INR: Prothrombin Time: 15.8 seconds — ABNORMAL HIGH (ref 11.6–15.2)

## 2011-08-13 MED FILL — Nitroglycerin IV Soln 100 MCG/ML in D5W: INTRA_ARTERIAL | Qty: 10 | Status: AC

## 2011-08-13 MED FILL — Chlorhexidine Gluconate Liquid 4%: CUTANEOUS | Qty: 60 | Status: AC

## 2011-08-13 NOTE — Progress Notes (Signed)
Subjective: Doing well. Ready to ambulate as soon as possible. Wanting foley out.   Objective: Vital signs in last 24 hours: Temp:  [98.2 F (36.8 C)-99.3 F (37.4 C)] 98.7 F (37.1 C) (01/02 0745) Pulse Rate:  [46-100] 73  (01/02 0700) Resp:  [13-23] 18  (01/02 0700) BP: (89-151)/(44-90) 105/51 mmHg (01/02 0700) SpO2:  [95 %-100 %] 97 % (01/02 0700) Last BM Date: 08/05/11  Intake/Output from previous day: 01/01 0701 - 01/02 0700 In: 4239.8 [P.O.:840; I.V.:2999.8; IV Piggyback:400] Out: 4175 [Urine:4175]     Physical exam : left leg with decreased edema above knee to groin.   Foot and ankle with edema remains, likely secondary to residual clot in smaller branches and calf.  Motor and sensory function intact.  Left groin non-tender. Leg is warm and dry.  Insertion site of prior catheter placement behind the knee is without erythema, edema or tenderness, redressed with tegaderm which should remain today andaced with bandaid on 08/15/11.  Lab Results:   Basename 08/13/11 0415 08/12/11 0418  WBC 6.8 5.7  HGB 11.9* 11.7*  HCT 34.9* 34.8*  PLT 189 226   BMET  Basename 08/11/11 0520  NA --  K 4.0  CL --  CO2 --  GLUCOSE --  BUN --  CREATININE 0.64  CALCIUM --   PT/INR  Basename 08/13/11 0415 08/12/11 0418  LABPROT 15.8* 17.3*  INR 1.23 1.39    Studies/Results: Ir Angiogram Follow Up Study  08/11/2011  *RADIOLOGY REPORT*  Clinical Data/Indication: 24 HOUR LEFT LOWER EXTREMITY VENOUS LYSIS CHECK  FOLLOW-UP VENOGRAM  Contrast Volume: 40 ml Omnipaque-300.  Fluoroscopy Time: 0.8 minutes.  Procedure: The procedure, risks, benefits, and alternatives were explained to the patient. Questions regarding the procedure were encouraged and answered. The patient understands and consents to the procedure.  Contrast was injected into the infusion catheter and popliteal vascular sheath for venography.  Connected place was then restarted after the procedure this plate between the  catheter and popliteal venous sheath.  Findings: There is improvement in the extensive DVT throughout the left lower extremity and iliac venous system.  Antegrade flow is established through a channel of moderate residual thrombus burden within the thigh and iliac venous system.  Overall there has been improvement.  Complications: None.  IMPRESSION: There has been significant improvement after 24 hours lysis with moderate residual thrombus burden.  Lysis will be continued, split between the popliteal venous sheath and infusion catheter at a total rate of 0.4 mg per hour.  Original Report Authenticated By: Donavan Burnet, M.D.   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  Thrombolytic infusion has now been continuous for the last 48 hours. 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  1.  FOLLOW-UP ANGIOGRAPHY OF LEFT LOWER EXTREMITY DURING THROMBOLYTIC THERAPY 2.  MECHANICAL THROMBECTOMY OF LEFT LOWER EXTREMITY VEINS 3.  VENOUS ANGIOPLASTY OF LEFT LOWER EXTREMITY VEINS 4.  INTRAVASCULAR STENT PLACEMENT IN THE LEFT COMMON ILIAC VEIN  Comparison:  08/11/2011 and multiple prior studies.  Sedation: 2.0 mg IV Versed; 150 mcg IV Fentanyl.  Total Moderate Sedation Time: 98 minutes.  Contrast:  60 ml Omnipaque-300  Additional Medications: 300 mcg intravenous nitroglycerin  Fluoroscopy Time: 10.3 minutes.  Procedure:  The procedure, risks, benefits, and alternatives were explained to the patient.  Questions regarding the procedure were encouraged and answered.  The patient understands and consents to the procedure.  The left popliteal sheath and surrounding skin as well as indwelling infusion catheter was prepped with betadine in a sterile  fashion, and a sterile drape was applied covering the operative field.  A sterile gown and sterile gloves were used for the procedure. Local anesthesia was provided with 1% Lidocaine.  Initial venography was performed through the popliteal vein sheath as well as the infusion catheter.  The infusion catheter was then removed over a guide wire.  Mechanical thrombectomy was performed throughout the left lower extremity and left iliac veins with the AngioJet device.  The 6-French popliteal sheath was removed and exchanged for a 7- [place] sheath.  Balloon angioplasty was performed at the level of the left common iliac vein with a 12 mm x 4 cm Atlas balloon. Additional angiography was then performed.  Intravascular stent placement was then performed at the level of the left common iliac vein with overlapping placement of a 14 mm x 60 mm Smart Stent across a preexisting stent.  After stent deployment, additional angiography was performed.  Additional balloon angioplasty was then performed at the level of the femoral veins and popliteal vein with an 8 mm x 4 cm Conquest balloon.  Additional angiography was performed.  Additional nitroglycerin administration was also performed through the sheath during the procedure with a total of 300 mcg of nitroglycerin administered.  After the procedure the sheath was removed and hemostasis obtained with manual compression.  A V pad was utilized.  Complications: None  Findings: Follow-up angiography shows improved patency of the veins in the leg with some nonocclusive residual thrombus remaining in the upper popliteal segment above the knee joint, femoral vein of the thigh and common femoral vein.  The external iliac vein shows essentially normal patency.  Lower aspect of the common iliac vein at the level of the preexisting stent was normally patent.  There was abrupt occlusion of the previously placed stent in its superior half.  This caused very sluggish flow through the veins  initially.  Mechanical thrombectomy resulted in improved patency of the veins with persistent occlusion of the common iliac vein stent.  After 12 mm balloon angioplasty, antegrade flow through the stent was restored with focal area of stenosis noted in the distal third of the stent approaching 80% in caliber.  Due to suboptimal response with balloon angioplasty, decision was made to place a new overlapping intravascular stent.  The 14 x 60 mm stent was positioned with slight more superior extension to the iliac venous confluence compared to the previously placed stent.  This acted to improve patency of the superior common iliac vein with only mild degree of residual stenosis present of approximately 20 - 25%.  Additional areas of venous narrowing in the femoral vein and popliteal vein were treated successfully with 8 mm balloon angioplasty with improved patency.  Nitroglycerin was administered during the procedure due to spasm around the indwelling guidewire, particularly at the level of the common femoral vein and external iliac vein.  This did improve enough to allow wire removal.  IMPRESSION: Successful completion of left lower extremity thrombolytic therapy. Residual nonocclusive thrombus was treated with mechanical thrombectomy and balloon angioplasty.  There was evidence of complete occlusion at the level of the indwelling common iliac stent.  This was treated with mechanical thrombectomy, balloon angioplasty and ultimately placement of a second overlapping stent with much improved patency.  Original Report Authenticated  By: Reola Calkins, M.D.   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  Thrombolytic infusion has now been continuous for the last 48 hours. 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  1.  FOLLOW-UP ANGIOGRAPHY OF LEFT LOWER EXTREMITY DURING THROMBOLYTIC THERAPY 2.  MECHANICAL THROMBECTOMY OF LEFT LOWER EXTREMITY VEINS 3.  VENOUS ANGIOPLASTY OF LEFT LOWER EXTREMITY VEINS 4.  INTRAVASCULAR STENT PLACEMENT IN THE LEFT COMMON ILIAC VEIN  Comparison:  08/11/2011 and multiple prior studies.  Sedation: 2.0 mg IV Versed; 150 mcg IV Fentanyl.  Total Moderate Sedation Time: 98 minutes.  Contrast:  60 ml Omnipaque-300  Additional Medications: 300 mcg intravenous nitroglycerin  Fluoroscopy Time: 10.3 minutes.  Procedure:  The procedure, risks, benefits, and alternatives were explained to the patient.  Questions regarding the procedure were encouraged and answered.  The patient understands and consents to the procedure.  The left popliteal sheath and surrounding skin as well as indwelling infusion catheter was prepped with betadine in a sterile fashion, and a sterile drape was applied covering the operative field.  A sterile gown and sterile gloves were used for the procedure. Local anesthesia was provided with 1% Lidocaine.  Initial venography was performed through the popliteal vein sheath as well as the infusion catheter.  The infusion catheter was then removed over a guide wire.  Mechanical thrombectomy was performed throughout the left lower extremity and left iliac veins with the AngioJet device.  The 6-French popliteal sheath was removed and exchanged for a 7- [place] sheath.  Balloon angioplasty was performed at the level of the left common iliac vein with a 12 mm x 4 cm Atlas balloon. Additional angiography was then performed.  Intravascular stent placement was then performed at the level of the left common iliac vein with overlapping placement of a 14 mm x 60 mm Smart Stent across a preexisting stent.  After stent deployment, additional angiography was performed.  Additional balloon angioplasty was then performed at the level of the femoral  veins and popliteal vein with an 8 mm x 4 cm Conquest balloon.  Additional angiography was performed.  Additional nitroglycerin administration was also performed through the sheath during the procedure with a total of 300 mcg of nitroglycerin administered.  After the procedure the sheath was removed and hemostasis obtained with manual compression.  A V pad was utilized.  Complications: None  Findings: Follow-up angiography shows improved patency of the veins in the leg with some nonocclusive residual thrombus remaining in the upper popliteal segment above the knee joint, femoral vein of the thigh and common femoral vein.  The external iliac vein shows essentially normal patency.  Lower aspect of the common iliac vein at the level of the preexisting stent was normally patent.  There was abrupt occlusion of the previously placed stent in its superior half.  This caused very sluggish flow through the veins initially.  Mechanical thrombectomy resulted in improved patency of the veins with persistent occlusion of the common iliac vein stent.  After 12 mm balloon angioplasty, antegrade flow through the stent was restored with focal area of stenosis noted in the distal third of the stent approaching 80% in caliber.  Due to suboptimal response with balloon angioplasty, decision was made to place a new overlapping intravascular stent.  The 14 x  60 mm stent was positioned with slight more superior extension to the iliac venous confluence compared to the previously placed stent.  This acted to improve patency of the superior common iliac vein with only mild degree of residual stenosis present of approximately 20 - 25%.  Additional areas of venous narrowing in the femoral vein and popliteal vein were treated successfully with 8 mm balloon angioplasty with improved patency.  Nitroglycerin was administered during the procedure due to spasm around the indwelling guidewire, particularly at the level of the common femoral vein and  external iliac vein.  This did improve enough to allow wire removal.  IMPRESSION: Successful completion of left lower extremity thrombolytic therapy. Residual nonocclusive thrombus was treated with mechanical thrombectomy and balloon angioplasty.  There was evidence of complete occlusion at the level of the indwelling common iliac stent.  This was treated with mechanical thrombectomy, balloon angioplasty and ultimately placement of a second overlapping stent with much improved patency.  Original Report Authenticated By: Reola Calkins, M.D.   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  Thrombolytic infusion has now been continuous for the last 48 hours. 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  1.  FOLLOW-UP ANGIOGRAPHY OF LEFT LOWER EXTREMITY DURING THROMBOLYTIC THERAPY 2.  MECHANICAL THROMBECTOMY OF LEFT LOWER EXTREMITY VEINS 3.  VENOUS ANGIOPLASTY OF LEFT LOWER EXTREMITY VEINS 4.  INTRAVASCULAR STENT PLACEMENT IN THE LEFT COMMON ILIAC VEIN  Comparison:  08/11/2011 and multiple prior studies.  Sedation: 2.0 mg IV Versed; 150 mcg IV Fentanyl.  Total Moderate Sedation Time: 98 minutes.  Contrast:  60 ml Omnipaque-300  Additional Medications: 300 mcg intravenous nitroglycerin  Fluoroscopy Time: 10.3 minutes.  Procedure:  The procedure, risks, benefits, and alternatives were explained to the patient.  Questions regarding the procedure were encouraged and answered.  The patient understands and consents to the procedure.  The left popliteal sheath and surrounding skin as well as indwelling infusion catheter was prepped with betadine in a sterile fashion, and a sterile drape was applied covering the operative field.  A sterile gown and sterile  gloves were used for the procedure. Local anesthesia was provided with 1% Lidocaine.  Initial venography was performed through the popliteal vein sheath as well as the infusion catheter.  The infusion catheter was then removed over a guide wire.  Mechanical thrombectomy was performed throughout the left lower extremity and left iliac veins with the AngioJet device.  The 6-French popliteal sheath was removed and exchanged for a 7- [place] sheath.  Balloon angioplasty was performed at the level of the left common iliac vein with a 12 mm x 4 cm Atlas balloon. Additional angiography was then performed.  Intravascular stent placement was then performed at the level of the left common iliac vein with overlapping placement of a 14 mm x 60 mm Smart Stent across a preexisting stent.  After stent deployment, additional angiography was performed.  Additional balloon angioplasty was then performed at the level of the femoral veins and popliteal vein with an 8 mm x 4 cm Conquest balloon.  Additional angiography was performed.  Additional nitroglycerin administration was also performed through the sheath during the procedure with a total of 300 mcg of nitroglycerin administered.  After the procedure the sheath was removed and hemostasis obtained with  manual compression.  A V pad was utilized.  Complications: None  Findings: Follow-up angiography shows improved patency of the veins in the leg with some nonocclusive residual thrombus remaining in the upper popliteal segment above the knee joint, femoral vein of the thigh and common femoral vein.  The external iliac vein shows essentially normal patency.  Lower aspect of the common iliac vein at the level of the preexisting stent was normally patent.  There was abrupt occlusion of the previously placed stent in its superior half.  This caused very sluggish flow through the veins initially.  Mechanical thrombectomy resulted in improved patency of the veins with persistent occlusion of  the common iliac vein stent.  After 12 mm balloon angioplasty, antegrade flow through the stent was restored with focal area of stenosis noted in the distal third of the stent approaching 80% in caliber.  Due to suboptimal response with balloon angioplasty, decision was made to place a new overlapping intravascular stent.  The 14 x 60 mm stent was positioned with slight more superior extension to the iliac venous confluence compared to the previously placed stent.  This acted to improve patency of the superior common iliac vein with only mild degree of residual stenosis present of approximately 20 - 25%.  Additional areas of venous narrowing in the femoral vein and popliteal vein were treated successfully with 8 mm balloon angioplasty with improved patency.  Nitroglycerin was administered during the procedure due to spasm around the indwelling guidewire, particularly at the level of the common femoral vein and external iliac vein.  This did improve enough to allow wire removal.  IMPRESSION: Successful completion of left lower extremity thrombolytic therapy. Residual nonocclusive thrombus was treated with mechanical thrombectomy and balloon angioplasty.  There was evidence of complete occlusion at the level of the indwelling common iliac stent.  This was treated with mechanical thrombectomy, balloon angioplasty and ultimately placement of a second overlapping stent with much improved patency.  Original Report Authenticated By: Reola Calkins, M.D.    Anti-infectives: Anti-infectives     Start     Dose/Rate Route Frequency Ordered Stop   08/09/11 1900   Ampicillin-Sulbactam (UNASYN) 3 g in sodium chloride 0.9 % 100 mL IVPB        3 g 100 mL/hr over 60 Minutes Intravenous 4 times per day 08/09/11 1826            Assessment/Plan: s/p lysis/stentint/thrombectomy in IR for left lower extremity DVT.  Patient had f/u study 08/12/11 completing his thrombolytic therapy with good results.  To continue heparin  as suggested by hematology for 4-5 days post completion of lysis procedure and then to be likely managed on arixtra or xarelto.  TED hose have been ordered in the interim until compression stockings fitted and available.  Will d/c foley per patient's request as no longer needed at this time.  IR to sign off at this time and follow prn.  No need to f/u with IR in OP setting unless acute changes occur. Thanks for allowing [place] to assist in the care of this patient.   LOS: 6 days   I personally examined and evaluated the patient.  Agree with above.     CAMPBELL,PAMELA D 08/13/2011

## 2011-08-13 NOTE — Progress Notes (Signed)
Mr. Leveque is doing better.  He was taken down to radiology yesterday. He had mechanical thrombectomy done.  He had intravascular stent placement into the left common iliac vein.  The angiogram showed improved patency within the leg.  There was some nonocclusive residual thrombus remaining in the upper popliteal segment above the knee joint, femoral vein and common femoral vein.  The external iliac vein shows normal patency.  There was abrupt occlusion of the stent in the superior aspect of the common iliac vein.  Mechanical thrombectomy was done.  A 12-mm balloon angioplasty was performed with resultant 80% reduction of stenosis.  He subsequently had a new overlapping intravascular stent placed.  There was about a 20-25 degree mild residual stenosis.  He is now off of thrombolytic therapy.  His catheters were removed.  He is still in the surgical ICU.  He possibly may be moved to a regular floor.  There is still some discomfort in the left leg from his manipulations from the angioplasty and thrombectomy.  He has had no cough or shortness of breath.  His appetite is okay.  His heparin was therapeutic with a level 0.63.  CBC also looked relatively stable.  Platelet count is 189,000.  PHYSICAL EXAMINATION:  Vital signs:  All looked good.  His temperature is 98.8, pulse 67, respiratory rate 21, blood pressure 98/55.  Head and neck exam:  Showed no ocular or oral lesions.  He had no palpable cervical, supraclavicular lymph nodes.  Lungs:  Clear bilaterally. Cardiac exam:  Regular rate and rhythm with no murmurs, rubs, or bruits. Abdominal exam:  Soft with good bowel sounds.  There is no palpable abdominal mass.  There is no fluid wave.  There is no palpable hepatosplenomegaly.  Extremities:  Shows improvement of the left leg edema.  There is still some slight nonpitting edema.  He has good pulses in his feet bilaterally.  ASSESSMENT AND PLAN:  At this point in time, we now need to  keep him on therapeutic heparin for 4 or 5 days.  I really believe that this is indicated such that he continues to have aggressive anticoagulation to minimize risk of recurrence.  He also will need to be seen by physical therapy such that a mechanical compression stocking can be measured for his left leg.  This will be critical for him.  We will continue to follow him along while in the hospital.  Once I feel he is able to be discharged, then we can convert him over to Arixtra.    ______________________________ [name], M.D. PRE/MEDQ  D:  08/13/2011  T:  08/13/2011  Job:  [id number]

## 2011-08-13 NOTE — Plan of Care (Signed)
Problem: Phase II Progression Outcomes Goal: Progress activity as tolerated unless otherwise ordered Outcome: Progressing OOB to chair.  PT consult for ambulation. Goal: Discharge plan established Outcome: Completed/Met Date Met:  08/13/11 Home with family

## 2011-08-13 NOTE — Progress Notes (Signed)
This office note has been dictated.

## 2011-08-13 NOTE — Progress Notes (Signed)
Mr. Arcidiacono is doing well.  He continues to tolerate the thrombolytic therapy nicely.  A repeat venogram done on the 31st showed significant improvement after 24-hour lysis.  There is still moderate residual thrombus burden.  There was anterograde flow established through a channel of moderate burden within the thigh iliac venous system.  He is on heparin currently.  His left leg is a little bit sore, mostly from his catheters that are in place.  There has been no cough or shortness of breath.  He has not been able to eat all that much secondary to his procedures.  His laboratory studies today show his hemoglobin to be 11.7, hematocrit 34.8.  Platelet count is 226.  His fibrinogen is 349.  Pharmacy is doing a great job managing his heparin.  His heparin level is 0.53.  I would certainly try to keep this on the higher end of therapeutic.  He has had no problems with nausea or vomiting.  His renal function has been doing nicely.  He continues on Unasyn for a possible upper left upper inner thigh cellulitis.  I did not note any cellulitis in that area currently.  PHYSICAL EXAMINATION:  This is a thin but well-nourished white gentleman no obvious distress.  Vital signs:  Temperature 98.3, pulse 82, respiratory rate 19, blood pressure 120/75.  Head and neck:  No ocular or oral lesions.  Lungs:  Clear bilaterally.  Cardiac:  Regular rate and rhythm with a normal S1, S2.  There are no murmurs, rubs, or bruits. Abdomen:  Soft with good bowel sounds.  There is no palpable abdominal mass.  There is no fluid wave.  There is no palpable hepatosplenomegaly. Extremities:  Show 1+ nonpitting edema of the left leg.  He has good pulses in his left foot.  No erythema is noted in the upper inner left thigh.  Neurologic:  Shows no focal neurological deficits.  He will continue to be monitored in the ICU.  I am not sure if they are going to do any mechanical thrombolysis today being that this is  a holiday.  I am sure they will probably take him back down for another venogram.  We will continue to follow him along.  This is a complicated situation.  We spent about 25-30 minutes with him today.   ______________________________ [name], M.D. PRE/MEDQ  D:  08/12/2011  T:  08/12/2011  Job:  [id number]

## 2011-08-13 NOTE — Progress Notes (Signed)
Patient ID: Willie Charles, male   DOB: March 31, 1991, 21 y.o.   MRN: [id number] Subjective: No events overnight. Patient denies chest pain, shortness of breath, abdominal pain.   Objective:  Vital signs in last 24 hours:  Filed Vitals:   08/13/11 0700 08/13/11 0745 08/13/11 0800 08/13/11 0900  BP: 105/51   107/62  Pulse: 73  78 82  Temp:  98.7 F (37.1 C)    TempSrc:  Oral    Resp: 18  19 22   Height:      Weight:      SpO2: 97%  96% 99%    Intake/Output from previous day:   Intake/Output Summary (Last 24 hours) at 08/13/11 0957 Last data filed at 08/13/11 0945  Gross per 24 hour  Intake 4349.75 ml  Output   5075 ml  Net -725.25 ml    Physical Exam: General: Alert, awake, oriented x3, in no acute distress. HEENT: No bruits, no goiter. Moist mucous membranes, no scleral icterus, no conjunctival pallor. Heart: Regular rate and rhythm, S1/S2 +, no murmurs, rubs, gallops. Lungs: Clear to auscultation bilaterally. No wheezing, no rhonchi, no rales.  Abdomen: Soft, nontender, nondistended, positive bowel sounds. Extremities: Left lower extremity swelling slowly improving; pulses palpable bilaterally Neuro: Grossly nonfocal.  Lab Results:  Basic Metabolic Panel:    Component Value Date/Time   NA 134* 08/07/2011 1441   K 4.0 08/11/2011 0520   CL 94* 08/07/2011 1441   CO2 27 08/07/2011 1441   BUN 12 08/07/2011 1441   CREATININE 0.64 08/11/2011 0520   GLUCOSE 99 08/07/2011 1441   CALCIUM 9.5 08/07/2011 1441   CBC:    Component Value Date/Time   WBC 6.8 08/13/2011 0415   HGB 11.9* 08/13/2011 0415   HCT 34.9* 08/13/2011 0415   PLT 189 08/13/2011 0415   MCV 84.5 08/13/2011 0415   NEUTROABS 6.6 08/07/2011 1441   LYMPHSABS 1.2 08/07/2011 1441   MONOABS 1.1* 08/07/2011 1441   EOSABS 0.1 08/07/2011 1441   BASOSABS 0.0 08/07/2011 1441      Lab 08/13/11 0415 08/12/11 0418 08/11/11 0520 08/10/11 0924 08/08/11 0640 08/07/11 1441  WBC 6.8 5.7 5.8 6.8 8.7 --  HGB 11.9* 11.7* 12.0*  12.5* 12.3* --  HCT 34.9* 34.8* 35.3* 37.4* 36.5* --  PLT 189 226 340 458* 341 --  MCV 84.5 85.1 84.7 86.0 85.1 --  MCH 28.8 28.6 28.8 28.7 28.7 --  MCHC 34.1 33.6 34.0 33.4 33.7 --  RDW 12.6 12.3 12.3 12.2 12.5 --  LYMPHSABS -- -- -- -- -- 1.2  MONOABS -- -- -- -- -- 1.1*  EOSABS -- -- -- -- -- 0.1  BASOSABS -- -- -- -- -- 0.0  BANDABS -- -- -- -- -- --    Lab 08/11/11 0520 08/08/11 0640 08/07/11 1441  NA -- -- 134*  K 4.0 4.7 4.5  CL -- -- 94*  CO2 -- -- 27  GLUCOSE -- -- 99  BUN -- -- 12  CREATININE 0.64 0.70 0.72  CALCIUM -- -- 9.5  MG -- -- --    Lab 08/13/11 0415 08/12/11 0418 08/11/11 0520 08/10/11 0924 08/09/11 0700  INR 1.23 1.39 1.44 1.72* 5.51*  PROTIME -- -- -- -- --   Cardiac markers: No results found for this basename: CK:3,CKMB:3,TROPONINI:3,MYOGLOBIN:3 in the last 168 hours No components found with this basename: POCBNP:3 Recent Results (from the past 240 hour(s))  MRSA PCR SCREENING     Status: Normal   Collection Time   08/07/11  5:48  PM      Component Value Range Status Comment   MRSA by PCR NEGATIVE  NEGATIVE  Final   CULTURE, BLOOD (ROUTINE X 2)     Status: Normal (Preliminary result)   Collection Time   08/11/11 11:00 AM      Component Value Range Status Comment   Specimen Description BLOOD RIGHT HAND   Final    Special Requests BOTTLES DRAWN AEROBIC AND ANAEROBIC 10CC   Final    Setup Time [id number]   Final    Culture     Final    Value:        BLOOD CULTURE RECEIVED NO GROWTH TO DATE CULTURE WILL BE HELD FOR 5 DAYS BEFORE ISSUING A FINAL NEGATIVE REPORT   Report Status PENDING   Incomplete   CULTURE, BLOOD (ROUTINE X 2)     Status: Normal (Preliminary result)   Collection Time   08/11/11 11:18 AM      Component Value Range Status Comment   Specimen Description BLOOD RIGHT HAND   Final    Special Requests BOTTLES DRAWN AEROBIC AND ANAEROBIC 10CC   Final    Setup Time [id number]   Final    Culture     Final    Value:        BLOOD  CULTURE RECEIVED NO GROWTH TO DATE CULTURE WILL BE HELD FOR 5 DAYS BEFORE ISSUING A FINAL NEGATIVE REPORT   Report Status PENDING   Incomplete     Studies/Results: Ir Angiogram Follow Up Study  08/11/2011  *RADIOLOGY REPORT*  Clinical Data/Indication: 24 HOUR LEFT LOWER EXTREMITY VENOUS LYSIS CHECK  FOLLOW-UP VENOGRAM    IMPRESSION: There has been significant improvement after 24 hours lysis with moderate residual thrombus burden.  Lysis will be continued, split between the popliteal venous sheath and infusion catheter at a total rate of 0.4 mg per hour.    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  Thrombolytic infusion has now been continuous for the last 48 hours. 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  1.  FOLLOW-UP ANGIOGRAPHY OF LEFT LOWER EXTREMITY DURING THROMBOLYTIC THERAPY 2.  MECHANICAL THROMBECTOMY OF LEFT LOWER EXTREMITY VEINS 3.  VENOUS ANGIOPLASTY OF LEFT LOWER EXTREMITY VEINS 4.  INTRAVASCULAR STENT PLACEMENT IN THE LEFT COMMON ILIAC VEIN   IMPRESSION: Successful completion of left lower extremity thrombolytic therapy. Residual nonocclusive thrombus was treated with mechanical thrombectomy and balloon angioplasty.  There was evidence of complete occlusion at the level of the indwelling common iliac stent.  This was treated with mechanical thrombectomy, balloon angioplasty and ultimately placement of a second overlapping stent with much improved patency.    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  Thrombolytic infusion has now  been continuous for the last 48 hours. 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  1.  FOLLOW-UP ANGIOGRAPHY OF LEFT LOWER EXTREMITY DURING THROMBOLYTIC THERAPY 2.  MECHANICAL THROMBECTOMY OF LEFT LOWER EXTREMITY VEINS 3.  VENOUS ANGIOPLASTY OF LEFT LOWER EXTREMITY VEINS 4.  INTRAVASCULAR STENT PLACEMENT IN THE LEFT COMMON ILIAC VEIN    IMPRESSION: Successful  completion of left lower extremity thrombolytic therapy. Residual nonocclusive thrombus was treated with mechanical thrombectomy and balloon angioplasty.  There was evidence of complete occlusion at the level of the indwelling common iliac stent.  This was treated with mechanical thrombectomy, balloon angioplasty and ultimately placement of a second overlapping stent with much improved patency.    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  Thrombolytic infusion has now been continuous for the last 48 hours. 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  1.  FOLLOW-UP ANGIOGRAPHY OF LEFT LOWER EXTREMITY DURING THROMBOLYTIC THERAPY 2.  MECHANICAL THROMBECTOMY OF LEFT LOWER EXTREMITY VEINS 3.  VENOUS ANGIOPLASTY OF LEFT LOWER EXTREMITY VEINS 4.  INTRAVASCULAR STENT PLACEMENT IN THE LEFT COMMON ILIAC VEIN  Comparison:  08/11/2011 and multiple prior studies.  Sedation: 2.0 mg IV Versed; 150 mcg IV Fentanyl.  Total Moderate Sedation Time: 98 minutes.  Contrast:  60 ml Omnipaque-300  Additional Medications: 300 mcg intravenous nitroglycerin  Fluoroscopy Time: 10.3 minutes.  Procedure:  The procedure, risks, benefits, and alternatives were explained to the patient.  Questions regarding the procedure were encouraged  and answered.  The patient understands and consents to the procedure.  The left popliteal sheath and surrounding skin as well as indwelling infusion catheter was prepped with betadine in a sterile fashion, and a sterile drape was applied covering the operative field.  A sterile gown and sterile gloves were used for the procedure. Local anesthesia was provided with 1% Lidocaine.  Initial venography was performed through the popliteal vein sheath as well as the infusion catheter.  The infusion catheter was then removed over a guide wire.  Mechanical thrombectomy was performed throughout the left lower extremity and left iliac veins with the AngioJet device.  The 6-French popliteal sheath was removed and exchanged for a 7- [place] sheath.  Balloon angioplasty was performed at the level of the left common iliac vein with a 12 mm x 4 cm Atlas balloon. Additional angiography was then performed.  Intravascular stent placement was then performed at the level of the left common iliac vein with overlapping placement of a 14 mm x 60 mm Smart Stent across a preexisting stent.  After stent deployment, additional angiography was performed.  Additional balloon angioplasty was then performed at the level of the femoral veins and popliteal vein with an 8 mm x 4 cm Conquest balloon.  Additional angiography was performed.  Additional nitroglycerin administration was also performed through the sheath during the procedure with a total of 300 mcg of nitroglycerin administered.  After the procedure the sheath was removed and hemostasis obtained with manual compression.  A V pad was utilized.  Complications: None  Findings: Follow-up angiography shows improved patency of the veins in the leg with some nonocclusive residual thrombus remaining in the upper popliteal segment above the knee joint, femoral vein of the thigh and common femoral vein.  The external iliac vein shows essentially normal patency.  Lower aspect of the common iliac vein  at the level of the preexisting stent was normally patent.  There was abrupt occlusion of the previously placed stent in its superior half.  This caused very sluggish flow through the veins initially.  Mechanical thrombectomy resulted in improved patency of the veins with persistent occlusion of the common iliac vein  stent.  After 12 mm balloon angioplasty, antegrade flow through the stent was restored with focal area of stenosis noted in the distal third of the stent approaching 80% in caliber.  Due to suboptimal response with balloon angioplasty, decision was made to place a new overlapping intravascular stent.  The 14 x 60 mm stent was positioned with slight more superior extension to the iliac venous confluence compared to the previously placed stent.  This acted to improve patency of the superior common iliac vein with only mild degree of residual stenosis present of approximately 20 - 25%.  Additional areas of venous narrowing in the femoral vein and popliteal vein were treated successfully with 8 mm balloon angioplasty with improved patency.  Nitroglycerin was administered during the procedure due to spasm around the indwelling guidewire, particularly at the level of the common femoral vein and external iliac vein.  This did improve enough to allow wire removal.  IMPRESSION: Successful completion of left lower extremity thrombolytic therapy. Residual nonocclusive thrombus was treated with mechanical thrombectomy and balloon angioplasty.  There was evidence of complete occlusion at the level of the indwelling common iliac stent.  This was treated with mechanical thrombectomy, balloon angioplasty and ultimately placement of a second overlapping stent with much improved patency.  Original Report Authenticated By: Reola Calkins, M.D.    Medications: Scheduled Meds:   . ampicillin-sulbactam (UNASYN) IV  3 g Intravenous Q6H  . sodium chloride  3 mL Intravenous Q12H   Continuous Infusions:   . sodium  chloride 125 mL/hr at 08/13/11 0900  . heparin 2,300 Units/hr (08/13/11 0800)  . DISCONTD: heparin 23 mL/hr (08/12/11 0700)  . DISCONTD: tenecteplase (TNKase) infusion 0.2 mg/hr (08/12/11 0335)  . DISCONTD: tenecteplase (TNKase) infusion Stopped (08/12/11 1000)   PRN Meds:.sodium chloride, acetaminophen, cyclobenzaprine, fentaNYL, midazolam, midazolam, morphine injection, ondansetron (ZOFRAN) IV, sodium chloride  Assessment/Plan:   Principal Problem:   *Left leg DVT  - recurrence of left lower extremity DVT  - s/p lysis/stentint/thrombectomy in IR for left lower extremity DVT with followup study 08/12/11 completing his thrombolytic therapy with good results. - continue heparin as suggested by hematology for 4-5 days post completion of lysis procedure and then to be likely managed on arixtra or xarelto. - hematology is following   Active Problems:   Coagulopathy  - perhaps protein C deficiency versus lupus anticoagulant  - aggressive anticoagulation for now   Fever  - follow up blood culture results show no growth to date  - continue Unasyn for now   EDUCATION  - test results and diagnostic studies were discussed with patient and pt's family who was present at the bedside  - patient and family have verbalized the understanding  - questions were answered at the bedside and contact information was provided for additional questions or concerns  Disposition - We will transfer the patient to telemetry unit    LOS: 6 days   Willie Charles 08/13/2011, 9:57 AM  [facility] Pager: [phone]

## 2011-08-13 NOTE — Progress Notes (Signed)
   CARE MANAGEMENT NOTE 08/13/2011  Patient:  Willie Charles, Willie Charles   Account Number:  [id number]  Date Initiated:  08/08/2011  Documentation initiated by:  [facility]  Subjective/Objective Assessment:   DVT - IR- thrombolytics.     Action/Plan:   Anticipated DC Date:  08/12/2011   Anticipated DC Plan:  HOME/SELF CARE      DC Planning Services  CM consult      Choice offered to / List presented to:             Status of service:  In process, will continue to follow Medicare Important Message given?   (If response is "NO", the following Medicare IM given date fields will be blank) Date Medicare IM given:   Date Additional Medicare IM given:    Discharge Disposition:    Per UR Regulation:  Reviewed for med. necessity/level of care/duration of stay  Comments:  08/13/2011 Onnie Boer, RN,BSN PT WILL BE TX TO TELE FLOOR AND WILL NEED PT/OT EVAL FOR DC NEEDS, WILL F/U.  08-08-11 8:30am Avie Arenas,  RNBSN [phone] UR Completed.

## 2011-08-13 NOTE — Progress Notes (Signed)
ANTICOAGULATION CONSULT NOTE - Follow Up Consult  Pharmacy Consult for Heparin Indication: DVT, off TNK  No Known Allergies  Patient Measurements: Height: 6' (182.9 cm) Weight: 125 lb 7.1 oz (56.9 kg) IBW/kg (Calculated) : 77.6    Vital Signs: Temp: 98.7 F (37.1 C) (01/02 0745) Temp src: Oral (01/02 0745) BP: 105/51 mmHg (01/02 0700) Pulse Rate: 73  (01/02 0700)  Labs:  Basename 08/13/11 0415 08/12/11 0418 08/11/11 0520  HGB 11.9* 11.7* --  HCT 34.9* 34.8* 35.3*  PLT 189 226 340  APTT -- -- --  LABPROT 15.8* 17.3* 17.8*  INR 1.23 1.39 1.44  HEPARINUNFRC 0.63 0.53 0.60  CREATININE -- -- 0.64  CKTOTAL -- -- --  CKMB -- -- --  TROPONINI -- -- --   Estimated Creatinine Clearance: 118.5 ml/min (by C-G formula based on Cr of 0.64).   Medications:  Scheduled:    . ampicillin-sulbactam (UNASYN) IV  3 g Intravenous Q6H  . sodium chloride  3 mL Intravenous Q12H    Assessment: 21 year old with extensive DVT, TNK has been discontinued, heparin level therapeutic this AM  Goal of Therapy:  Heparin level 0.3-0.7 units/ml   Plan:  1) Continue heparin at 2250 units / hour 2) Follow up AM heparin level, CBC 3) Watch platelet count closely  Thank you.  Elwin Sleight 08/13/2011,9:24 AM

## 2011-08-13 NOTE — Progress Notes (Signed)
Physical Therapy Evaluation Patient Details Name: Willie Charles MRN: [id number] DOB: Jul 17, 1991 Today's Date: 08/13/2011  Problem List:  Patient Active Problem List  Diagnoses  . Left leg DVT  . Coagulopathy  . Fever    Past Medical History:  Past Medical History  Diagnosis Date  . No pertinent past medical history   . DVT (deep venous thrombosis)    Past Surgical History:  Past Surgical History  Procedure Date  . Mouth surgery   . No past surgeries     PT Assessment/Plan/Recommendation PT Assessment Clinical Impression Statement: Pt is a young gentleman with LLE DVT s/p stenting. Is moving very well.  Demonstrates a general decrease in activity tolerance and balance deficits secondary to sensation changes and surgery to LLE. Will benefit from physical therapy in the acute setting to maximize independent and mobility for safe transition home and back to school. Depending pt's progress while here in the hospital he may also benefit from outpatient physical therapy. Will continue to reassess. Will be staying with mother until he goes back to school. He was concerned about the ability to walk to class. If pt is not at a point where he can walk the full distance to class secondary to pain and fatigue he may need assist/written note so that he may get a ride to class. Ed pt on benefits of ambulating multiple times daily. Will use a cane next visit as he wasn't relying on RW much. Also suggested pt may be safer sitting to shower given his sensation changes and weakness on left from surgery. RN has already discussed this with him but reiterated need to change position often to prevent future blood clots.  PT Recommendation/Assessment: Patient will need skilled PT in the acute care venue PT Problem List: Decreased mobility;Decreased activity tolerance;Decreased balance PT Therapy Diagnosis : Difficulty walking;Abnormality of gait;Generalized weakness;Acute pain PT Plan PT Frequency: Min  5X/week PT Treatment/Interventions: Gait training;DME instruction;Stair training;Functional mobility training;Neuromuscular re-education;Balance training;Therapeutic exercise;Therapeutic activities;Patient/family education PT Recommendation Follow Up Recommendations: Outpatient PT Equipment Recommended:  (TBD)-will try SPC with pt PT Goals  Acute Rehab PT Goals PT Goal Formulation: With patient Pt will go Sit to Stand: Independently PT Goal: Sit to Stand - Progress: Progressing toward goal Pt will go Stand to Sit: Independently PT Goal: Stand to Sit - Progress: Progressing toward goal Pt will Stand: Independently PT Goal: Stand - Progress: Progressing toward goal Pt will Ambulate: >150 feet;with modified independence;with least restrictive assistive device PT Goal: Ambulate - Progress: Progressing toward goal Pt will Go Up / Down Stairs: 3-5 stairs;with modified independence;with least restrictive assistive device PT Goal: Up/Down Stairs - Progress: Progressing toward goal Pt will Perform Home Exercise Program: Independently PT Goal: Perform Home Exercise Program - Progress: Progressing toward goal Additional Goals Additional Goal #1: Pt will demonstrate decreased balance deficits with DGI greater than or equal to 20/24 with LRAD.  PT Goal: Additional Goal #1 - Progress: Progressing toward goal  PT Evaluation Precautions/Restrictions  Restrictions Weight Bearing Restrictions: No Prior Functioning  Home Living Lives With:  (roommate; but with his parents until he goes back to school) Receives Help From: Family;Friend(s) Type of Home: House Home Layout: One level Home Access: Stairs to enter Entrance Stairs-Rails: Right Entrance Stairs-Number of Steps: 3 Bathroom Shower/Tub: Engineer, manufacturing systems: Standard Home Adaptive Equipment: None Prior Function Level of Independence: Independent with basic ADLs;Independent with homemaking with ambulation;Independent with  gait;Independent with transfers Driving: Yes Vocation: Student Comments: Will be with his mother for the  next week but is concerned about going back to school in [place] and walking to class Cognition Cognition Arousal/Alertness: Awake/alert Overall Cognitive Status: Appears within functional limits for tasks assessed Sensation/Coordination Sensation Light Touch: Impaired Detail Light Touch Impaired Details: Impaired LLE Proprioception: Impaired Detail Proprioception Impaired Details: Impaired LLE Additional Comments: pt having difficulty telling that his foot is on floor; reports a wierd sensation, appears intact to pressure Coordination Gross Motor Movements are Fluid and Coordinated: Yes Fine Motor Movements are Fluid and Coordinated: Yes Extremity Assessment RUE Assessment RUE Assessment: Within Functional Limits LUE Assessment LUE Assessment: Within Functional Limits RLE Assessment RLE Assessment: Within Functional Limits LLE Assessment LLE Assessment: Within Functional Limits Mobility (including Balance) Transfers Transfers: Yes Sit to Stand: 5: Supervision Sit to Stand Details (indicate cue type and reason): verbal cueing for safe technique/hand placement Stand to Sit: 5: Supervision Ambulation/Gait Ambulation/Gait: Yes Ambulation/Gait Assistance: 5: Supervision Ambulation/Gait Assistance Details (indicate cue type and reason): amb. approx 300 ft with RW and supervision; cueing primarily for more normalized gait (decrease reliance on RW, increased upright posture); pt with decreased stride length on left and decreased propriorception during stance time on left as pt reports he coudln't tell if his foot was ono the Glass blower/designer (Feet): 300 Feet Assistive device: Rolling walker Gait Pattern: Decreased stride length;Decreased stance time - left;Decreased step length - right;Trunk flexed  Posture/Postural Control Posture/Postural Control: No significant  limitations Balance Balance Assessed: Yes Static Standing Balance Static Standing - Comment/# of Minutes: Pt with slight imbalance secondary to decrease senstation in left foot but able to compensate well with RW.  Exercise  Total Joint Exercises Ankle Circles/Pumps: AROM;Both;10 reps;Seated End of Session PT - End of Session Equipment Utilized During Treatment: Gait belt Activity Tolerance: Patient tolerated treatment well;Patient limited by pain;Patient limited by fatigue Patient left: in chair;with call bell in reach;with family/visitor present Nurse Communication: Mobility status for transfers;Mobility status for ambulation General Behavior During Session: [facility] for tasks performed Cognition: [facility] for tasks performed  [facility] HELEN 08/13/2011, 1:04 PM

## 2011-08-14 LAB — CBC
Hemoglobin: 11.2 g/dL — ABNORMAL LOW (ref 13.0–17.0)
MCH: 29 pg (ref 26.0–34.0)
RBC: 3.86 MIL/uL — ABNORMAL LOW (ref 4.22–5.81)

## 2011-08-14 LAB — PROTIME-INR: Prothrombin Time: 15 seconds (ref 11.6–15.2)

## 2011-08-14 LAB — HEPARIN LEVEL (UNFRACTIONATED): Heparin Unfractionated: 0.36 IU/mL (ref 0.30–0.70)

## 2011-08-14 NOTE — Progress Notes (Signed)
Patient ID: Willie Charles, male   DOB: November 23, 1990, 21 y.o.   MRN: [id number] Subjective: No events overnight. Patient denies chest pain, shortness of breath, abdominal pain.   Objective:  Vital signs in last 24 hours:  Filed Vitals:   08/13/11 1426 08/13/11 2110 08/14/11 0450 08/14/11 1314  BP: 94/58 101/62 118/68 112/68  Pulse: 94 80 70 76  Temp: 97.9 F (36.6 C) 98.2 F (36.8 C) 98 F (36.7 C) 97.7 F (36.5 C)  TempSrc: Oral Oral Oral Oral  Resp: 16 18 18 18   Height:      Weight:      SpO2: 97% 97% 97% 97%    Intake/Output from previous day:   Intake/Output Summary (Last 24 hours) at 08/14/11 1618 Last data filed at 08/14/11 1300  Gross per 24 hour  Intake   3158 ml  Output    200 ml  Net   2958 ml    Physical Exam: General: Alert, awake, oriented x3, in no acute distress. HEENT: No bruits, no goiter. Moist mucous membranes, no scleral icterus, no conjunctival pallor. Heart: Regular rate and rhythm, S1/S2 +, no murmurs, rubs, gallops. Lungs: Clear to auscultation bilaterally. No wheezing, no rhonchi, no rales.  Abdomen: Soft, nontender, nondistended, positive bowel sounds. Extremities: Left lower extremity swelling slowly improving; pulses palpable bilaterally Neuro: Grossly nonfocal.  Lab Results:  Basic Metabolic Panel:    Component Value Date/Time   NA 134* 08/07/2011 1441   K 4.0 08/11/2011 0520   CL 94* 08/07/2011 1441   CO2 27 08/07/2011 1441   BUN 12 08/07/2011 1441   CREATININE 0.64 08/11/2011 0520   GLUCOSE 99 08/07/2011 1441   CALCIUM 9.5 08/07/2011 1441   CBC:    Component Value Date/Time   WBC 6.2 08/14/2011 0520   HGB 11.2* 08/14/2011 0520   HCT 33.4* 08/14/2011 0520   PLT 178 08/14/2011 0520   MCV 86.5 08/14/2011 0520   NEUTROABS 6.6 08/07/2011 1441   LYMPHSABS 1.2 08/07/2011 1441   MONOABS 1.1* 08/07/2011 1441   EOSABS 0.1 08/07/2011 1441   BASOSABS 0.0 08/07/2011 1441      Lab 08/14/11 0520 08/13/11 0415 08/12/11 0418 08/11/11 0520  08/10/11 0924  WBC 6.2 6.8 5.7 5.8 6.8  HGB 11.2* 11.9* 11.7* 12.0* 12.5*  HCT 33.4* 34.9* 34.8* 35.3* 37.4*  PLT 178 189 226 340 458*  MCV 86.5 84.5 85.1 84.7 86.0  MCH 29.0 28.8 28.6 28.8 28.7  MCHC 33.5 34.1 33.6 34.0 33.4  RDW 12.7 12.6 12.3 12.3 12.2  LYMPHSABS -- -- -- -- --  MONOABS -- -- -- -- --  EOSABS -- -- -- -- --  BASOSABS -- -- -- -- --  BANDABS -- -- -- -- --    Lab 08/11/11 0520 08/08/11 0640  NA -- --  K 4.0 4.7  CL -- --  CO2 -- --  GLUCOSE -- --  BUN -- --  CREATININE 0.64 0.70  CALCIUM -- --  MG -- --    Lab 08/14/11 0520 08/13/11 0415 08/12/11 0418 08/11/11 0520 08/10/11 0924  INR 1.16 1.23 1.39 1.44 1.72*  PROTIME -- -- -- -- --   Cardiac markers: No results found for this basename: CK:3,CKMB:3,TROPONINI:3,MYOGLOBIN:3 in the last 168 hours No components found with this basename: POCBNP:3 Recent Results (from the past 240 hour(s))  MRSA PCR SCREENING     Status: Normal   Collection Time   08/07/11  5:48 PM      Component Value Range Status Comment  MRSA by PCR NEGATIVE  NEGATIVE  Final   CULTURE, BLOOD (ROUTINE X 2)     Status: Normal (Preliminary result)   Collection Time   08/11/11 11:00 AM      Component Value Range Status Comment   Specimen Description BLOOD RIGHT HAND   Final    Special Requests BOTTLES DRAWN AEROBIC AND ANAEROBIC 10CC   Final    Setup Time [id number]   Final    Culture     Final    Value:        BLOOD CULTURE RECEIVED NO GROWTH TO DATE CULTURE WILL BE HELD FOR 5 DAYS BEFORE ISSUING A FINAL NEGATIVE REPORT   Report Status PENDING   Incomplete   CULTURE, BLOOD (ROUTINE X 2)     Status: Normal (Preliminary result)   Collection Time   08/11/11 11:18 AM      Component Value Range Status Comment   Specimen Description BLOOD RIGHT HAND   Final    Special Requests BOTTLES DRAWN AEROBIC AND ANAEROBIC 10CC   Final    Setup Time [id number]   Final    Culture     Final    Value:        BLOOD CULTURE RECEIVED NO GROWTH TO  DATE CULTURE WILL BE HELD FOR 5 DAYS BEFORE ISSUING A FINAL NEGATIVE REPORT   Report Status PENDING   Incomplete     Studies/Results: Ir Angiogram Follow Up Study 08/11/2011   IMPRESSION: There has been significant improvement after 24 hours lysis with moderate residual thrombus burden.  Lysis will be continued, split between the popliteal venous sheath and infusion catheter at a total rate of 0.4 mg per hour.    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  Thrombolytic infusion has now been continuous for the last 48 hours. 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  1.  FOLLOW-UP ANGIOGRAPHY OF LEFT LOWER EXTREMITY DURING THROMBOLYTIC THERAPY 2.  MECHANICAL THROMBECTOMY OF LEFT LOWER EXTREMITY VEINS 3.  VENOUS ANGIOPLASTY OF LEFT LOWER EXTREMITY VEINS 4.  INTRAVASCULAR STENT PLACEMENT IN THE LEFT COMMON ILIAC VEIN   IMPRESSION: Successful completion of left lower extremity thrombolytic therapy. Residual nonocclusive thrombus was treated with mechanical thrombectomy and balloon angioplasty.  There was evidence of complete occlusion at the level of the indwelling common iliac stent.  This was treated with mechanical thrombectomy, balloon angioplasty and ultimately placement of a second overlapping stent with much improved patency.    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  Thrombolytic infusion has now been continuous for the last 48 hours. The common iliac vein was previously stented on 08/03/2011 for underlying chronic stenosis consistent with May-  Thurner anatomy.  1.  FOLLOW-UP ANGIOGRAPHY OF LEFT LOWER EXTREMITY DURING THROMBOLYTIC THERAPY 2.  MECHANICAL THROMBECTOMY OF LEFT LOWER EXTREMITY VEINS 3.  VENOUS ANGIOPLASTY OF LEFT LOWER EXTREMITY VEINS 4.  INTRAVASCULAR STENT PLACEMENT IN THE LEFT COMMON ILIAC VEIN    IMPRESSION: Successful completion of left lower extremity thrombolytic therapy. Residual nonocclusive thrombus was treated with mechanical thrombectomy and balloon angioplasty.  There was evidence of complete occlusion at the level of the indwelling common  iliac stent.  This was treated with mechanical thrombectomy, balloon angioplasty and ultimately placement of a second overlapping stent with much improved patency.    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  Thrombolytic infusion has now been continuous for the last 48 hours. 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  1.  FOLLOW-UP ANGIOGRAPHY OF LEFT LOWER EXTREMITY DURING THROMBOLYTIC THERAPY 2.  MECHANICAL THROMBECTOMY OF LEFT LOWER EXTREMITY VEINS 3.  VENOUS ANGIOPLASTY OF LEFT LOWER EXTREMITY VEINS 4.  INTRAVASCULAR STENT PLACEMENT IN THE LEFT COMMON ILIAC VEIN  Comparison:  08/11/2011 and multiple prior studies.  Sedation: 2.0 mg IV Versed; 150 mcg IV Fentanyl.  Total Moderate Sedation Time: 98 minutes.  Contrast:  60 ml Omnipaque-300  Additional Medications: 300 mcg intravenous nitroglycerin  Fluoroscopy Time: 10.3 minutes.  Procedure:  The procedure, risks, benefits, and alternatives were explained to the patient.  Questions regarding the procedure were encouraged and answered.  The patient understands and consents to the procedure.  The left popliteal sheath and surrounding skin as well as indwelling infusion  catheter was prepped with betadine in a sterile fashion, and a sterile drape was applied covering the operative field.  A sterile gown and sterile gloves were used for the procedure. Local anesthesia was provided with 1% Lidocaine.  Initial venography was performed through the popliteal vein sheath as well as the infusion catheter.  The infusion catheter was then removed over a guide wire.  Mechanical thrombectomy was performed throughout the left lower extremity and left iliac veins with the AngioJet device.  The 6-French popliteal sheath was removed and exchanged for a 7- [place] sheath.  Balloon angioplasty was performed at the level of the left common iliac vein with a 12 mm x 4 cm Atlas balloon. Additional angiography was then performed.  Intravascular stent placement was then performed at the level of the left common iliac vein with overlapping placement of a 14 mm x 60 mm Smart Stent across a preexisting stent.  After stent deployment, additional angiography was performed.  Additional balloon angioplasty was then performed at the level of the femoral veins and popliteal vein with an 8 mm x 4 cm Conquest balloon.  Additional angiography was performed.  Additional nitroglycerin administration was also performed through the sheath during the procedure with a total of 300 mcg of nitroglycerin administered.  After the procedure the sheath was removed and hemostasis obtained with manual compression.  A V pad was utilized.  Complications: None  Findings: Follow-up angiography shows improved patency of the veins in the leg with some nonocclusive residual thrombus remaining in the upper popliteal segment above the knee joint, femoral vein of the thigh and common femoral vein.  The external iliac vein shows essentially normal patency.  Lower aspect of the common iliac vein at the level of the preexisting stent was normally patent.  There was abrupt occlusion of the previously placed stent in its superior half.  This  caused very sluggish flow through the veins initially.  Mechanical thrombectomy resulted in improved patency of the veins with persistent occlusion of the common iliac vein stent.  After 12 mm balloon angioplasty, antegrade flow through the stent was restored with focal area of stenosis noted in the distal third of the stent approaching 80% in caliber.  Due to suboptimal response with balloon angioplasty, decision was made to place a new overlapping intravascular stent.  The 14 x 60 mm stent was positioned with slight more superior extension to the iliac venous confluence compared to the previously placed stent.  This acted to improve patency of the superior common iliac vein with only mild degree of residual stenosis present of approximately 20 - 25%.  Additional areas of venous narrowing in the femoral vein and popliteal vein were treated successfully with 8 mm balloon angioplasty with improved patency.  Nitroglycerin was administered during the procedure due to spasm around the indwelling guidewire, particularly at the level of the common femoral vein and external iliac vein.  This did improve enough to allow wire removal.  IMPRESSION: Successful completion of left lower extremity thrombolytic therapy. Residual nonocclusive thrombus was treated with mechanical thrombectomy and balloon angioplasty.  There was evidence of complete occlusion at the level of the indwelling common iliac stent.  This was treated with mechanical thrombectomy, balloon angioplasty and ultimately placement of a second overlapping stent with much improved patency.  Original Report Authenticated By: Reola Calkins, M.D.    Medications: Scheduled Meds:    . sodium chloride  3 mL Intravenous Q12H  . DISCONTD: ampicillin-sulbactam (UNASYN) IV  3 g Intravenous Q6H   Continuous Infusions:    . sodium chloride 125 mL/hr at 08/14/11 1032  . heparin 2,300 Units/hr (08/14/11 1030)   PRN Meds:.sodium chloride, acetaminophen,  cyclobenzaprine, fentaNYL, midazolam, morphine injection, sodium chloride  Assessment/Plan:   Principal Problem:   *Left leg DVT  - recurrence of left lower extremity DVT  - s/p lysis/stentint/thrombectomy in IR for left lower extremity DVT with followup study 08/12/11 completing his thrombolytic therapy with good results. - continue heparin as suggested by hematology for 3-4 more days ,  i.e 4-5 days post completion of lysis procedure and then to be likely managed on arixtra or xarelto. - hematology is following   Active Problems:   Coagulopathy  - perhaps protein C deficiency versus lupus anticoagulant  Anticoagulation as above     LOS: 7 days   Arjun Hard 08/14/2011, 4:18 PM  [facility] Pager: [phone]

## 2011-08-14 NOTE — Progress Notes (Signed)
Is a note for [employer].  Mr. Willie Charles is doing okay. He is on heparin right now.  He has a compression stocking on his left leg. He is out of bed and a relating.  He's had no bleeding. There's no cough or shortness of breath.  Appeitite is picking up now that he can eat. There is no nausea or vomiting.  His vital signs are temperature 90.8 pulse 70 respiratory rate 18 blood pressure 118/68. His lungs are clear bilaterally. Cardiac exam regular rhythm with no murmurs rubs or bruits. Abdominal is soft with good bowel sounds. There is no palpable hepatosplenomegaly. Extremities shows mild nonpitting edema of the left leg. He has no erythema in the left leg.  I will stop his Unasyn after today.  I truly believe that he is going to need another 4 days of heparin. He really needs to be aggressively anticoagulated.  Once he is ready to be discharged, he can then be placed on Arixtra.  I would plan on either Sunday or Monday for discharge after a therapeutic course of heparin.  This is dictated by Christin Bach. Thanks

## 2011-08-14 NOTE — Progress Notes (Signed)
Assessment: 20 yoM with recurrent, severe iliofemoral LLE DVT despite supratherapeutic INR, IVC stent, and ASA, Lovenox, and Coumadin. 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 Partial thrombolysis with TNK and heparin completed, but stopped prior to venogram 12/28 which showed persistent occlusive thrombus in left thigh and left iliac veins with patent IVC. Heparin restarted 12/29.    TNK discontinued Heparin level this AM = 0.36 (goal = 0.3 to 0.7); Plt 178; INR 1.16  Plan:  1) No change in heparin (2300 units / hr).  Team plan on 4 more days of heparin and put on Arixtra at discharge. 2) Follow AM heparin level 3) Watch Platelet count

## 2011-08-14 NOTE — Progress Notes (Signed)
Physical Therapy Treatment Patient Details Name: Willie Charles MRN: [id number] DOB: 1990-08-14 Today's Date: 08/14/2011  PT Assessment/Plan  PT - Assessment/Plan Comments on Treatment Session: Spent a lot of today educating pt on safety for return to school (especially with icy conditions given it is [place]). Pt also educated on sensation return and will provide HEP handout for pt and mother to perform together. Will progress higher level balance activities next session including more dynamic standing especially with compliant surfaces and encouraging use of LLE in all directions for strengthening and increased ROM at ankle and knee. Ed pt on quad sets for increased knee extension and standing toe/heel raises.  PT Plan: Discharge plan remains appropriate PT Frequency: Min 5X/week Follow Up Recommendations: Outpatient PT Equipment Recommended: None recommended by PT PT Goals  Acute Rehab PT Goals PT Goal: Sit to Stand - Progress: Progressing toward goal PT Goal: Stand to Sit - Progress: Progressing toward goal PT Goal: Stand - Progress: Progressing toward goal PT Goal: Ambulate - Progress: Progressing toward goal PT Goal: Up/Down Stairs - Progress: Progressing toward goal PT Goal: Perform Home Exercise Program - Progress: Progressing toward goal Additional Goals PT Goal: Additional Goal #1 - Progress: Progressing toward goal Additional Goal #2: Pt will be able to maintain single limb stance on LLE >/= 60 seconds independently while performing an UE activity.  PT Goal: Additional Goal #2 - Progress: Progressing toward goal  PT Treatment Precautions/Restrictions  Restrictions Weight Bearing Restrictions: No LLE Weight Bearing: Non weight bearing Mobility (including Balance) Bed Mobility Bed Mobility: Yes Supine to Sit: 6: Modified independent (Device/Increase time);HOB elevated (Comment degrees) (30 degrees) Transfers Sit to Stand: 5: Supervision;6: Modified independent (Device/Increase  time);With upper extremity assist;From bed Stand to Sit: 5: Supervision Stand to Sit Details: safety cues Ambulation/Gait Ambulation/Gait Assistance Details (indicate cue type and reason): pt attempted amb with one axilary crutch on right but having difficulty sequencing, tending to scuff crutch when advancing it; pt then amb. approx 450 ft without AD but with mingaurdA; pt amb with slight lateral lean to right for antalgic gait; still has decreased knee extension at heel strike and decrease stride on left; slight staggering with head turns, see higher level balance section for details Ambulation Distance (Feet): 450 Feet Assistive device: None (see details above) Stairs: Yes Stairs Assistance: 5: Supervision Stairs Assistance Details (indicate cue type and reason): cueing for safe step by step sequencing; pt ascended with right foot first and descended by placing left foot on step belowl; right rail to ascend and left rail to descend; limited by IV length Stair Management Technique: Step to pattern;One rail Right;One rail Left Number of Stairs: 3  Height of Stairs:  (standard)  Posture/Postural Control Posture/Postural Control: No significant limitations Balance Balance Assessed: Yes Static Standing Balance Static Standing - Level of Assistance: 5: Stand by assistance Static Standing - Comment/# of Minutes: pt able to participate in various static standing activities including compliant surfaces as well as noncompliant; pt stood statically independently with less weight on LLE; then pt able to do bilateral SLS (see below) with SBA; stood Romberg with SBA secondary to increased sway but able to stand at least 60 seconds without hands on assist or any major LOB; on the compliant surface (pillow) pt with slight sway which increased significantly with performance on compliant surface and even more with eyes closed; however no LOB, pt able to correct self independently Single Leg Stance - Right Leg:  60  Single Leg Stance - Left Leg: 20  Rhomberg - Eyes Opened: 60  Rhomberg - Eyes Closed: 60  (increased sway) High Level Balance High Level Balance Activites: Head turns;Sudden stops;Direction changes;Turns High Level Balance Comments: Pt with increased difficulty with horizontal head turns secondary to decreased sensation in foot cueing pt for increased awareness to this deficit; provided education for compensatory techniques and the process of sensation return; not difficulty with vertical head turns; pt does amb with a relatively slower gait speed, likely a more cautious gait as pt able to increase speed with cues but is definitely overcompensating with a steppage type gait on left (no foot dropped noted however)  Exercise  Total Joint Exercises Ankle Circles/Pumps: AROM;Both;Seated End of Session PT - End of Session Equipment Utilized During Treatment: Gait belt Activity Tolerance: Patient tolerated treatment well Patient left: in chair;with call bell in reach;with family/visitor present (LLE propped with billow and reclined) Nurse Communication: Mobility status for transfers;Mobility status for ambulation General Behavior During Session: [facility] for tasks performed Cognition: Willie Charles for tasks performed  [facility] Willie Charles 08/14/2011, 12:07 PM

## 2011-08-15 ENCOUNTER — Telehealth: Payer: Self-pay | Admitting: *Deleted

## 2011-08-15 LAB — CBC
Platelets: 178 10*3/uL (ref 150–400)
RBC: 3.87 MIL/uL — ABNORMAL LOW (ref 4.22–5.81)
WBC: 6.1 10*3/uL (ref 4.0–10.5)

## 2011-08-15 LAB — HEPARIN LEVEL (UNFRACTIONATED): Heparin Unfractionated: 0.75 IU/mL — ABNORMAL HIGH (ref 0.30–0.70)

## 2011-08-15 MED ORDER — FONDAPARINUX SODIUM 7.5 MG/0.6ML ~~LOC~~ SOLN
7.5000 mg | Freq: Every day | SUBCUTANEOUS | Status: AC
  Administered 2011-08-16 – 2011-08-17 (×2): 7.5 mg via SUBCUTANEOUS
  Filled 2011-08-15 (×2): qty 0.6

## 2011-08-15 NOTE — Progress Notes (Signed)
PT ambulated three times today, each time he walked the whole unit (1200 ft each). With no complaints. Will continue to monitor

## 2011-08-15 NOTE — Telephone Encounter (Signed)
Dr. Myna Hidalgo has been taking care of Pt in the hospital. Per MD Pt does not need to come to office.

## 2011-08-15 NOTE — Progress Notes (Signed)
ANTICOAGULATION CONSULT NOTE - Follow Up Consult  Pharmacy Consult for Heparin Indication: LLE DVT  Assessment: 21 y.o. M on heparin for recurrent LLE DVT despite lovenox, supratherapeutic INR on warfarin, IVC, and low-dose ASA. The patient is s/p lysis and stent placement. Per MD note this a.m to switch over to Arixtra SQ + Xarelto on 08/15/10. Proper transition from heparin to arixtra would be to initiate arixtra ~1 hour after heparin drip discontinued. Heparin level this afternoon 0.57 at goal of 0.3-0.7 Heparin drip rate 2250 uts/hr . No s/sx of bleeding noted.  Goal of Therapy:  Heparin level 0.3-0.7 units/ml   Plan:  1.Continue  heparin drip rate of 2250 units/hr (22.5 ml/hr) 2. Will continue to monitor for any signs/symptoms of bleeding and will follow up with heparin level with AM labs  Rolley Sims 08/15/2011,8:43 AM   No Known Allergies  Patient Measurements: Height: 6' (182.9 cm) Weight: 125 lb 7.1 oz (56.9 kg) IBW/kg (Calculated) : 77.6    Vital Signs: Temp: 98.2 F (36.8 C) (01/04 1424) Temp src: Oral (01/04 1424) BP: 105/69 mmHg (01/04 1424) Pulse Rate: 77  (01/04 1424)  Labs:  Basename 08/15/11 1531 08/15/11 0510 08/14/11 0520 08/13/11 0415  HGB -- 10.9* 11.2* --  HCT -- 33.4* 33.4* 34.9*  PLT -- 178 178 189  APTT -- -- -- --  LABPROT -- -- 15.0 15.8*  INR -- -- 1.16 1.23  HEPARINUNFRC 0.57 0.75* 0.36 --  CREATININE -- -- -- --  CKTOTAL -- -- -- --  CKMB -- -- -- --  TROPONINI -- -- -- --   Estimated Creatinine Clearance: 118.5 ml/min (by C-G formula based on Cr of 0.64).  Medications:  Prescriptions prior to admission  Medication Sig Dispense Refill  . aspirin 81 MG chewable tablet Chew 1 tablet (81 mg total) by mouth daily.  30 tablet  0  . cyclobenzaprine (FLEXERIL) 5 MG tablet Take 1 tablet (5 mg total) by mouth 3 (three) times daily as needed for muscle spasms.  30 tablet  0  . enoxaparin (LOVENOX) 80 MG/0.8ML SOLN injection Inject 0.8  mLs (80 mg total) into the skin daily.  7 Syringe  0  . PRESCRIPTION MEDICATION Take 1 tablet by mouth daily as needed. For pain Medication is a prescribed pain medication.       Marland Kitchen warfarin (COUMADIN) 5 MG tablet Take 5 mg by mouth daily at 6 PM.         Scheduled:     . fondaparinux (ARIXTRA) injection  7.5 mg Subcutaneous Daily  . sodium chloride  3 mL Intravenous Q12H

## 2011-08-15 NOTE — Progress Notes (Signed)
Patient ID: Willie Charles, male   DOB: 1991-08-06, 21 y.o.   MRN: [id number] Subjective: No events overnight. Patient denies chest pain, shortness of breath, abdominal pain.   Objective:  Vital signs in last 24 hours:  Filed Vitals:   08/14/11 0450 08/14/11 1314 08/14/11 2139 08/15/11 0500  BP: 118/68 112/68 104/58 112/62  Pulse: 70 76 62 91  Temp: 98 F (36.7 C) 97.7 F (36.5 C) 98 F (36.7 C) 97.6 F (36.4 C)  TempSrc: Oral Oral Oral Oral  Resp: 18 18 18 18   Height:      Weight:      SpO2: 97% 97% 98% 94%    Intake/Output from previous day:   Intake/Output Summary (Last 24 hours) at 08/15/11 1405 Last data filed at 08/14/11 2000  Gross per 24 hour  Intake    480 ml  Output      0 ml  Net    480 ml    Physical Exam: General: Alert, awake, oriented x3, in no acute distress. HEENT: No bruits, no goiter. Moist mucous membranes, no scleral icterus, no conjunctival pallor. Heart: Regular rate and rhythm, S1/S2 +, no murmurs, rubs, gallops. Lungs: Clear to auscultation bilaterally. No wheezing, no rhonchi, no rales.  Abdomen: Soft, nontender, nondistended, positive bowel sounds. Extremities: Left lower extremity swelling slowly improving; pulses palpable bilaterally Neuro: Grossly nonfocal.  Lab Results:  Basic Metabolic Panel:    Component Value Date/Time   NA 134* 08/07/2011 1441   K 4.0 08/11/2011 0520   CL 94* 08/07/2011 1441   CO2 27 08/07/2011 1441   BUN 12 08/07/2011 1441   CREATININE 0.64 08/11/2011 0520   GLUCOSE 99 08/07/2011 1441   CALCIUM 9.5 08/07/2011 1441   CBC:    Component Value Date/Time   WBC 6.1 08/15/2011 0510   HGB 10.9* 08/15/2011 0510   HCT 33.4* 08/15/2011 0510   PLT 178 08/15/2011 0510   MCV 86.3 08/15/2011 0510   NEUTROABS 6.6 08/07/2011 1441   LYMPHSABS 1.2 08/07/2011 1441   MONOABS 1.1* 08/07/2011 1441   EOSABS 0.1 08/07/2011 1441   BASOSABS 0.0 08/07/2011 1441      Lab 08/15/11 0510 08/14/11 0520 08/13/11 0415 08/12/11 0418  08/11/11 0520  WBC 6.1 6.2 6.8 5.7 5.8  HGB 10.9* 11.2* 11.9* 11.7* 12.0*  HCT 33.4* 33.4* 34.9* 34.8* 35.3*  PLT 178 178 189 226 340  MCV 86.3 86.5 84.5 85.1 84.7  MCH 28.2 29.0 28.8 28.6 28.8  MCHC 32.6 33.5 34.1 33.6 34.0  RDW 12.9 12.7 12.6 12.3 12.3  LYMPHSABS -- -- -- -- --  MONOABS -- -- -- -- --  EOSABS -- -- -- -- --  BASOSABS -- -- -- -- --  BANDABS -- -- -- -- --    Lab 08/11/11 0520  NA --  K 4.0  CL --  CO2 --  GLUCOSE --  BUN --  CREATININE 0.64  CALCIUM --  MG --    Lab 08/14/11 0520 08/13/11 0415 08/12/11 0418 08/11/11 0520 08/10/11 0924  INR 1.16 1.23 1.39 1.44 1.72*  PROTIME -- -- -- -- --   Cardiac markers: No results found for this basename: CK:3,CKMB:3,TROPONINI:3,MYOGLOBIN:3 in the last 168 hours No components found with this basename: POCBNP:3 Recent Results (from the past 240 hour(s))  MRSA PCR SCREENING     Status: Normal   Collection Time   08/07/11  5:48 PM      Component Value Range Status Comment   MRSA by PCR NEGATIVE  NEGATIVE  Final   CULTURE, BLOOD (ROUTINE X 2)     Status: Normal (Preliminary result)   Collection Time   08/11/11 11:00 AM      Component Value Range Status Comment   Specimen Description BLOOD RIGHT HAND   Final    Special Requests BOTTLES DRAWN AEROBIC AND ANAEROBIC 10CC   Final    Setup Time [id number]   Final    Culture     Final    Value:        BLOOD CULTURE RECEIVED NO GROWTH TO DATE CULTURE WILL BE HELD FOR 5 DAYS BEFORE ISSUING A FINAL NEGATIVE REPORT   Report Status PENDING   Incomplete   CULTURE, BLOOD (ROUTINE X 2)     Status: Normal (Preliminary result)   Collection Time   08/11/11 11:18 AM      Component Value Range Status Comment   Specimen Description BLOOD RIGHT HAND   Final    Special Requests BOTTLES DRAWN AEROBIC AND ANAEROBIC 10CC   Final    Setup Time [id number]   Final    Culture     Final    Value:        BLOOD CULTURE RECEIVED NO GROWTH TO DATE CULTURE WILL BE HELD FOR 5 DAYS BEFORE  ISSUING A FINAL NEGATIVE REPORT   Report Status PENDING   Incomplete     Studies/Results: Ir Angiogram Follow Up Study 08/11/2011   IMPRESSION: There has been significant improvement after 24 hours lysis with moderate residual thrombus burden.  Lysis will be continued, split between the popliteal venous sheath and infusion catheter at a total rate of 0.4 mg per hour.    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  Thrombolytic infusion has now been continuous for the last 48 hours. 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  1.  FOLLOW-UP ANGIOGRAPHY OF LEFT LOWER EXTREMITY DURING THROMBOLYTIC THERAPY 2.  MECHANICAL THROMBECTOMY OF LEFT LOWER EXTREMITY VEINS 3.  VENOUS ANGIOPLASTY OF LEFT LOWER EXTREMITY VEINS 4.  INTRAVASCULAR STENT PLACEMENT IN THE LEFT COMMON ILIAC VEIN   IMPRESSION: Successful completion of left lower extremity thrombolytic therapy. Residual nonocclusive thrombus was treated with mechanical thrombectomy and balloon angioplasty.  There was evidence of complete occlusion at the level of the indwelling common iliac stent.  This was treated with mechanical thrombectomy, balloon angioplasty and ultimately placement of a second overlapping stent with much improved patency.    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  Thrombolytic infusion has now been continuous for the last 48 hours. 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  1.  FOLLOW-UP ANGIOGRAPHY  OF LEFT LOWER EXTREMITY DURING THROMBOLYTIC THERAPY 2.  MECHANICAL THROMBECTOMY OF LEFT LOWER EXTREMITY VEINS 3.  VENOUS ANGIOPLASTY OF LEFT LOWER EXTREMITY VEINS 4.  INTRAVASCULAR STENT PLACEMENT IN THE LEFT COMMON ILIAC VEIN    IMPRESSION: Successful completion of left lower extremity thrombolytic therapy. Residual nonocclusive thrombus was treated with mechanical thrombectomy and balloon angioplasty.  There was evidence of complete occlusion at the level of the indwelling common iliac stent.  This was treated with  mechanical thrombectomy, balloon angioplasty and ultimately placement of a second overlapping stent with much improved patency.    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  Thrombolytic infusion has now been continuous for the last 48 hours. 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  1.  FOLLOW-UP ANGIOGRAPHY OF LEFT LOWER EXTREMITY DURING THROMBOLYTIC THERAPY 2.  MECHANICAL THROMBECTOMY OF LEFT LOWER EXTREMITY VEINS 3.  VENOUS ANGIOPLASTY OF LEFT LOWER EXTREMITY VEINS 4.  INTRAVASCULAR STENT PLACEMENT IN THE LEFT COMMON ILIAC VEIN  Comparison:  08/11/2011 and multiple prior studies.  Sedation: 2.0 mg IV Versed; 150 mcg IV Fentanyl.  Total Moderate Sedation Time: 98 minutes.  Contrast:  60 ml Omnipaque-300  Additional Medications: 300 mcg intravenous nitroglycerin  Fluoroscopy Time: 10.3 minutes.  Procedure:  The procedure, risks, benefits, and alternatives were explained to the patient.  Questions regarding the procedure were encouraged and answered.  The patient understands and consents to the procedure.  The left popliteal sheath and surrounding skin as well as indwelling infusion catheter was prepped with betadine in a sterile  fashion, and a sterile drape was applied covering the operative field.  A sterile gown and sterile gloves were used for the procedure. Local anesthesia was provided with 1% Lidocaine.  Initial venography was performed through the popliteal vein sheath as well as the infusion catheter.  The infusion catheter was then removed over a guide wire.  Mechanical thrombectomy was performed throughout the left lower extremity and left iliac veins with the AngioJet device.  The 6-French popliteal sheath was removed and exchanged for a 7- [place] sheath.  Balloon angioplasty was performed at the level of the left common iliac vein with a 12 mm x 4 cm Atlas balloon. Additional angiography was then performed.  Intravascular stent placement was then performed at the level of the left common iliac vein with overlapping placement of a 14 mm x 60 mm Smart Stent across a preexisting stent.  After stent deployment, additional angiography was performed.  Additional balloon angioplasty was then performed at the level of the femoral veins and popliteal vein with an 8 mm x 4 cm Conquest balloon.  Additional angiography was performed.  Additional nitroglycerin administration was also performed through the sheath during the procedure with a total of 300 mcg of nitroglycerin administered.  After the procedure the sheath was removed and hemostasis obtained with manual compression.  A V pad was utilized.  Complications: None  Findings: Follow-up angiography shows improved patency of the veins in the leg with some nonocclusive residual thrombus remaining in the upper popliteal segment above the knee joint, femoral vein of the thigh and common femoral vein.  The external iliac vein shows essentially normal patency.  Lower aspect of the common iliac vein at the level of the preexisting stent was normally patent.  There was abrupt occlusion of the previously placed stent in its superior half.  This caused very sluggish flow through the veins  initially.  Mechanical thrombectomy resulted in improved patency of the veins with persistent occlusion of the common iliac vein stent.  After 12 mm balloon angioplasty, antegrade flow through the stent was restored with focal area of stenosis noted in the distal third of the stent approaching 80% in caliber.  Due to suboptimal response with balloon angioplasty,  decision was made to place a new overlapping intravascular stent.  The 14 x 60 mm stent was positioned with slight more superior extension to the iliac venous confluence compared to the previously placed stent.  This acted to improve patency of the superior common iliac vein with only mild degree of residual stenosis present of approximately 20 - 25%.  Additional areas of venous narrowing in the femoral vein and popliteal vein were treated successfully with 8 mm balloon angioplasty with improved patency.  Nitroglycerin was administered during the procedure due to spasm around the indwelling guidewire, particularly at the level of the common femoral vein and external iliac vein.  This did improve enough to allow wire removal.  IMPRESSION: Successful completion of left lower extremity thrombolytic therapy. Residual nonocclusive thrombus was treated with mechanical thrombectomy and balloon angioplasty.  There was evidence of complete occlusion at the level of the indwelling common iliac stent.  This was treated with mechanical thrombectomy, balloon angioplasty and ultimately placement of a second overlapping stent with much improved patency.  Original Report Authenticated By: Reola Calkins, M.D.    Medications: Scheduled Meds:    . fondaparinux (ARIXTRA) injection  7.5 mg Subcutaneous Daily  . sodium chloride  3 mL Intravenous Q12H   Continuous Infusions:    . sodium chloride 125 mL/hr at 08/15/11 0814  . heparin 2,250 Units/hr (08/15/11 0922)   PRN Meds:.sodium chloride, acetaminophen, cyclobenzaprine, fentaNYL, midazolam, morphine  injection, sodium chloride  Assessment/Plan:   Principal Problem:   *Left leg DVT  - recurrence of left lower extremity DVT  - s/p lysis/stentint/thrombectomy in IR for left lower extremity DVT with followup study 08/12/11 completing his thrombolytic therapy with good results. - continue heparin as suggested by hematology till Sunday and then transition to Arixtra per Dr. Gustavo Lah recommendation  Coagulopathy  - perhaps protein C deficiency versus lupus anticoagulant  Anticoagulation as above     LOS: 8 days   Keishawn Rajewski 08/15/2011, 2:05 PM  [facility] Pager: [phone]

## 2011-08-15 NOTE — Progress Notes (Signed)
Physical Therapy Treatment Patient Details Name: Willie Charles MRN: [id number] DOB: [birth year] Today's Date: 08/15/2011  PT Assessment/Plan  PT - Assessment/Plan Comments on Treatment Session: Pt doing much better today. Continues to ambulate with nursing. Is modI with all ambulation, no major balance deficits at this point. Will continue to demonstrate an antalgic gait until his knee heals however pt aware of this and has been educated on safety techniques. Pt and mother have HEP handout. At this point pt with no further acute PT needs. If he continues to have trouble I have educated pt  on the option for OPPT to continue with balance training. Pt agreeable to continue ambulating with nursing and with his mother. PT signing off.  PT Plan: All goals met and education completed, patient dischaged from PT services Follow Up Recommendations: None PT Goals  Acute Rehab PT Goals PT Goal: Sit to Stand - Progress: Met PT Goal: Stand to Sit - Progress: Met PT Goal: Stand - Progress: Met PT Goal: Ambulate - Progress: Met PT Goal: Up/Down Stairs - Progress: Met PT Goal: Perform Home Exercise Program - Progress: Met Additional Goals PT Goal: Additional Goal #1 - Progress: Met PT Goal: Additional Goal #2 - Progress: Met  PT Treatment Precautions/Restrictions  Restrictions Weight Bearing Restrictions: No LLE Weight Bearing: Non weight bearing Mobility (including Balance) Transfers Sit to Stand: 6: Modified independent (Device/Increase time) Stand to Sit: 6: Modified independent (Device/Increase time) Ambulation/Gait Ambulation/Gait Assistance: 6: Modified independent (Device/Increase time) Ambulation/Gait Assistance Details (indicate cue type and reason): Amb with slower speed and slight antalgic gait on left, no LOB, good ability to negotiate obstacles and change speed when necessary, obviously still affected by decreased knee extension likely secondary to swelling but pt ed on quad sets for  improvement in this area  Posture/Postural Control Posture/Postural Control: No significant limitations Balance Balance Assessed: Yes Static Standing Balance Static Standing - Level of Assistance: 7: Independent Single Leg Stance - Left Leg: 60  Dynamic Gait Index Level Surface: Mild Impairment Change in Gait Speed: Mild Impairment Gait with Horizontal Head Turns: Normal Gait with Vertical Head Turns: Normal Gait and Pivot Turn: Normal Step Over Obstacle: Normal Step Around Obstacles: Normal Steps: Mild Impairment Total Score: 21  Exercise    End of Session PT - End of Session Equipment Utilized During Treatment: Gait belt Activity Tolerance: Patient tolerated treatment well Patient left: in chair;with call bell in reach (foot propped) General Behavior During Session: [facility] for tasks performed Cognition: [facility] for tasks performed  [facility] HELEN 08/15/2011, 3:16 PM

## 2011-08-15 NOTE — Progress Notes (Signed)
ANTICOAGULATION CONSULT NOTE - Follow Up Consult  Pharmacy Consult for Heparin Indication: LLE DVT  Assessment: 21 y.o. M on heparin for recurrent LLE DVT despite lovenox, supratherapeutic INR on warfarin, IVC, and low-dose ASA. The patient is s/p lysis and stent placement. Per MD note this a.m to switch over to Arixtra SQ + Xarelto on 08/15/10. Proper transition from heparin to arixtra would be to initiate arixtra ~1 hour after heparin drip discontinued. Heparin level this a.m is slightly SUPRAtherapeutic (HL 0.75, goal of 0.3-0.7)--will decrease rate and recheck this afternoon. No s/sx of bleeding noted.  Goal of Therapy:  Heparin level 0.3-0.7 units/ml   Plan:  1. Decrease heparin drip to rate of 2250 units/hr (22.5 ml/hr) 2. Will continue to monitor for any signs/symptoms of bleeding and will follow up with heparin level in 6 hours   Rolley Sims 08/15/2011,8:43 AM   No Known Allergies  Patient Measurements: Height: 6' (182.9 cm) Weight: 125 lb 7.1 oz (56.9 kg) IBW/kg (Calculated) : 77.6    Vital Signs: Temp: 97.6 F (36.4 C) (01/04 0500) Temp src: Oral (01/04 0500) BP: 112/62 mmHg (01/04 0500) Pulse Rate: 91  (01/04 0500)  Labs:  Basename 08/15/11 0510 08/14/11 0520 08/13/11 0415  HGB 10.9* 11.2* --  HCT 33.4* 33.4* 34.9*  PLT 178 178 189  APTT -- -- --  LABPROT -- 15.0 15.8*  INR -- 1.16 1.23  HEPARINUNFRC 0.75* 0.36 0.63  CREATININE -- -- --  CKTOTAL -- -- --  CKMB -- -- --  TROPONINI -- -- --   Estimated Creatinine Clearance: 118.5 ml/min (by C-G formula based on Cr of 0.64).  Medications:  Prescriptions prior to admission  Medication Sig Dispense Refill  . aspirin 81 MG chewable tablet Chew 1 tablet (81 mg total) by mouth daily.  30 tablet  0  . cyclobenzaprine (FLEXERIL) 5 MG tablet Take 1 tablet (5 mg total) by mouth 3 (three) times daily as needed for muscle spasms.  30 tablet  0  . enoxaparin (LOVENOX) 80 MG/0.8ML SOLN injection Inject 0.8 mLs  (80 mg total) into the skin daily.  7 Syringe  0  . PRESCRIPTION MEDICATION Take 1 tablet by mouth daily as needed. For pain Medication is a prescribed pain medication.       Marland Kitchen warfarin (COUMADIN) 5 MG tablet Take 5 mg by mouth daily at 6 PM.         Scheduled:    . fondaparinux (ARIXTRA) injection  7.5 mg Subcutaneous Daily  . sodium chloride  3 mL Intravenous Q12H

## 2011-08-15 NOTE — Progress Notes (Signed)
The patient is doing well. He is not having as swelling with his left leg. He has a non-fitted compression stocking on his left leg.  He is on heparin drip.  I will switch him over to Arixtra  at 7.5 mg subcutaneous daily starting on January 5.  I gave his mother prescriptions for Arixtra, Xarelto, and a fitted compression stocking. Hopefully, he will get his fitted compression stocking while in the hospital.  His appetite is good. He's had no nausea vomiting. He is eating well. He's had no cough or shortness of breath. He's had no problems going to the bathroom.  Physical exam his vital signs were all stable. His lungs are clear bilaterally. Cardiac exam regular rate rhythm with no murmurs rubs or bruits. Abdominals soft with good bowel sounds. Extremities shows minimal nonpitting edema of the left leg. He has good range of motion of his left leg.  I believe that he can be discharged to home on Sunday. He really needs to make sure that he is instructed with using the subcutaneous anticoagulant. Would make sure that this is done for him.  Again Tom, I gave him prescriptions of that he will need was he goes off to school in [place]. He will be referred by the local school student health clinic for to a local hematologist for followup.  Fathered any questions that I hear his parents have, they have my cord which they can call me.  He is doing very well. He's had a Veress response to thrombolytic therapy and heparin infusion.

## 2011-08-15 NOTE — Progress Notes (Signed)
Pt has met all goals and is being d/c'd from therapy at this time. Recommend pt continue ambulating 3-4x/day. Thanks for the consult! Ivonne Andrew PT, DPT [phone]

## 2011-08-16 DIAGNOSIS — I82409 Acute embolism and thrombosis of unspecified deep veins of unspecified lower extremity: Secondary | ICD-10-CM

## 2011-08-16 DIAGNOSIS — D689 Coagulation defect, unspecified: Secondary | ICD-10-CM

## 2011-08-16 LAB — CBC
Platelets: 199 10*3/uL (ref 150–400)
RBC: 3.92 MIL/uL — ABNORMAL LOW (ref 4.22–5.81)
WBC: 6.5 10*3/uL (ref 4.0–10.5)

## 2011-08-16 LAB — HEPARIN LEVEL (UNFRACTIONATED): Heparin Unfractionated: 0.59 IU/mL (ref 0.30–0.70)

## 2011-08-16 NOTE — Progress Notes (Addendum)
Instructed patient on Arixtra injection administration.  Pt had mother to administer lovenox in previous discharge.  Pt feels comfortable about med admin.  Educated patient and family on SQ Arixtra to change to PO Xarelto after discharge.  Will continue monitoring patient while here.  Plan to have patient admin am dose. Thomas Hoff  Pt/family given discharge instructions, medication administration, appointment follow up and s/sx infection and when to call the MD.  Jovita Gamma pt paperwork to fax for medical records to [facility] in [place].  Thomas Hoff

## 2011-08-16 NOTE — Progress Notes (Signed)
Patient ID: Willie Charles, male   DOB: 03-04-1991, 21 y.o.   MRN: [id number] Subjective: No events overnight. Patient denies chest pain, shortness of breath, abdominal pain.   Objective:  Vital signs in last 24 hours:  Filed Vitals:   08/15/11 0500 08/15/11 1424 08/15/11 2046 08/16/11 0613  BP: 112/62 105/69 114/68 109/63  Pulse: 91 77 77 75  Temp: 97.6 F (36.4 C) 98.2 F (36.8 C) 98.6 F (37 C) 98.4 F (36.9 C)  TempSrc: Oral Oral Oral Oral  Resp: 18 18 18 18   Height:      Weight:      SpO2: 94% 100% 97% 98%    Intake/Output from previous day:   Intake/Output Summary (Last 24 hours) at 08/16/11 1223 Last data filed at 08/16/11 0925  Gross per 24 hour  Intake 4928.68 ml  Output      0 ml  Net 4928.68 ml    Physical Exam: General: Alert, awake, oriented x3, in no acute distress. HEENT: No bruits, no goiter. Moist mucous membranes, no scleral icterus, no conjunctival pallor. Heart: Regular rate and rhythm, S1/S2 +, no murmurs, rubs, gallops. Lungs: Clear to auscultation bilaterally. No wheezing, no rhonchi, no rales.  Abdomen: Soft, nontender, nondistended, positive bowel sounds. Extremities: Left lower extremity swelling slowly improving; pulses palpable bilaterally Neuro: Grossly nonfocal.  Lab Results:  Basic Metabolic Panel:    Component Value Date/Time   NA 134* 08/07/2011 1441   K 4.0 08/11/2011 0520   CL 94* 08/07/2011 1441   CO2 27 08/07/2011 1441   BUN 12 08/07/2011 1441   CREATININE 0.64 08/11/2011 0520   GLUCOSE 99 08/07/2011 1441   CALCIUM 9.5 08/07/2011 1441   CBC:    Component Value Date/Time   WBC 6.5 08/16/2011 0600   HGB 11.3* 08/16/2011 0600   HCT 33.8* 08/16/2011 0600   PLT 199 08/16/2011 0600   MCV 86.2 08/16/2011 0600   NEUTROABS 6.6 08/07/2011 1441   LYMPHSABS 1.2 08/07/2011 1441   MONOABS 1.1* 08/07/2011 1441   EOSABS 0.1 08/07/2011 1441   BASOSABS 0.0 08/07/2011 1441      Lab 08/16/11 0600 08/15/11 0510 08/14/11 0520 08/13/11 0415  08/12/11 0418  WBC 6.5 6.1 6.2 6.8 5.7  HGB 11.3* 10.9* 11.2* 11.9* 11.7*  HCT 33.8* 33.4* 33.4* 34.9* 34.8*  PLT 199 178 178 189 226  MCV 86.2 86.3 86.5 84.5 85.1  MCH 28.8 28.2 29.0 28.8 28.6  MCHC 33.4 32.6 33.5 34.1 33.6  RDW 13.0 12.9 12.7 12.6 12.3  LYMPHSABS -- -- -- -- --  MONOABS -- -- -- -- --  EOSABS -- -- -- -- --  BASOSABS -- -- -- -- --  BANDABS -- -- -- -- --    Lab 08/11/11 0520  NA --  K 4.0  CL --  CO2 --  GLUCOSE --  BUN --  CREATININE 0.64  CALCIUM --  MG --    Lab 08/14/11 0520 08/13/11 0415 08/12/11 0418 08/11/11 0520 08/10/11 0924  INR 1.16 1.23 1.39 1.44 1.72*  PROTIME -- -- -- -- --   Cardiac markers: No results found for this basename: CK:3,CKMB:3,TROPONINI:3,MYOGLOBIN:3 in the last 168 hours No components found with this basename: POCBNP:3 Recent Results (from the past 240 hour(s))  MRSA PCR SCREENING     Status: Normal   Collection Time   08/07/11  5:48 PM      Component Value Range Status Comment   MRSA by PCR NEGATIVE  NEGATIVE  Final   CULTURE, BLOOD (  ROUTINE X 2)     Status: Normal (Preliminary result)   Collection Time   08/11/11 11:00 AM      Component Value Range Status Comment   Specimen Description BLOOD RIGHT HAND   Final    Special Requests BOTTLES DRAWN AEROBIC AND ANAEROBIC 10CC   Final    Setup Time [id number]   Final    Culture     Final    Value:        BLOOD CULTURE RECEIVED NO GROWTH TO DATE CULTURE WILL BE HELD FOR 5 DAYS BEFORE ISSUING A FINAL NEGATIVE REPORT   Report Status PENDING   Incomplete   CULTURE, BLOOD (ROUTINE X 2)     Status: Normal (Preliminary result)   Collection Time   08/11/11 11:18 AM      Component Value Range Status Comment   Specimen Description BLOOD RIGHT HAND   Final    Special Requests BOTTLES DRAWN AEROBIC AND ANAEROBIC 10CC   Final    Setup Time [id number]   Final    Culture     Final    Value:        BLOOD CULTURE RECEIVED NO GROWTH TO DATE CULTURE WILL BE HELD FOR 5 DAYS BEFORE  ISSUING A FINAL NEGATIVE REPORT   Report Status PENDING   Incomplete     Studies/Results: Ir Angiogram Follow Up Study 08/11/2011   IMPRESSION: There has been significant improvement after 24 hours lysis with moderate residual thrombus burden.  Lysis will be continued, split between the popliteal venous sheath and infusion catheter at a total rate of 0.4 mg per hour.    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  Thrombolytic infusion has now been continuous for the last 48 hours. 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  1.  FOLLOW-UP ANGIOGRAPHY OF LEFT LOWER EXTREMITY DURING THROMBOLYTIC THERAPY 2.  MECHANICAL THROMBECTOMY OF LEFT LOWER EXTREMITY VEINS 3.  VENOUS ANGIOPLASTY OF LEFT LOWER EXTREMITY VEINS 4.  INTRAVASCULAR STENT PLACEMENT IN THE LEFT COMMON ILIAC VEIN   IMPRESSION: Successful completion of left lower extremity thrombolytic therapy. Residual nonocclusive thrombus was treated with mechanical thrombectomy and balloon angioplasty.  There was evidence of complete occlusion at the level of the indwelling common iliac stent.  This was treated with mechanical thrombectomy, balloon angioplasty and ultimately placement of a second overlapping stent with much improved patency.    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  Thrombolytic infusion has now been continuous for the last 48 hours. 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  1.  FOLLOW-UP ANGIOGRAPHY  OF LEFT LOWER EXTREMITY DURING THROMBOLYTIC THERAPY 2.  MECHANICAL THROMBECTOMY OF LEFT LOWER EXTREMITY VEINS 3.  VENOUS ANGIOPLASTY OF LEFT LOWER EXTREMITY VEINS 4.  INTRAVASCULAR STENT PLACEMENT IN THE LEFT COMMON ILIAC VEIN    IMPRESSION: Successful completion of left lower extremity thrombolytic therapy. Residual nonocclusive thrombus was treated with mechanical thrombectomy and balloon angioplasty.  There was evidence of complete occlusion at the level of the indwelling common iliac stent.  This was treated with mechanical thrombectomy, balloon angioplasty and  ultimately placement of a second overlapping stent with much improved patency.    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  Thrombolytic infusion has now been continuous for the last 48 hours. 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  1.  FOLLOW-UP ANGIOGRAPHY OF LEFT LOWER EXTREMITY DURING THROMBOLYTIC THERAPY 2.  MECHANICAL THROMBECTOMY OF LEFT LOWER EXTREMITY VEINS 3.  VENOUS ANGIOPLASTY OF LEFT LOWER EXTREMITY VEINS 4.  INTRAVASCULAR STENT PLACEMENT IN THE LEFT COMMON ILIAC VEIN  Comparison:  08/11/2011 and multiple prior studies.  Sedation: 2.0 mg IV Versed; 150 mcg IV Fentanyl.  Total Moderate Sedation Time: 98 minutes.  Contrast:  60 ml Omnipaque-300  Additional Medications: 300 mcg intravenous nitroglycerin  Fluoroscopy Time: 10.3 minutes.  Procedure:  The procedure, risks, benefits, and alternatives were explained to the patient.  Questions regarding the procedure were encouraged and answered.  The patient understands and consents to the procedure.  The left popliteal sheath and surrounding skin as well as indwelling infusion catheter was prepped with betadine in a sterile  fashion, and a sterile drape was applied covering the operative field.  A sterile gown and sterile gloves were used for the procedure. Local anesthesia was provided with 1% Lidocaine.  Initial venography was performed through the popliteal vein sheath as well as the infusion catheter.  The infusion catheter was then removed over a guide wire.  Mechanical thrombectomy was performed throughout the left lower extremity and left iliac veins with the AngioJet device.  The 6-French popliteal sheath was removed and exchanged for a 7- [place] sheath.  Balloon angioplasty was performed at the level of the left common iliac vein with a 12 mm x 4 cm Atlas balloon. Additional angiography was then performed.  Intravascular stent placement was then performed at the level of the left common iliac vein with overlapping placement of a 14 mm x 60 mm Smart Stent across a preexisting stent.  After stent deployment, additional angiography was performed.  Additional balloon angioplasty was then performed at the level of the femoral veins and popliteal vein with an 8 mm x 4 cm Conquest balloon.  Additional angiography was performed.  Additional nitroglycerin administration was also performed through the sheath during the procedure with a total of 300 mcg of nitroglycerin administered.  After the procedure the sheath was removed and hemostasis obtained with manual compression.  A V pad was utilized.  Complications: None  Findings: Follow-up angiography shows improved patency of the veins in the leg with some nonocclusive residual thrombus remaining in the upper popliteal segment above the knee joint, femoral vein of the thigh and common femoral vein.  The external iliac vein shows essentially normal patency.  Lower aspect of the common iliac vein at the level of the preexisting stent was normally patent.  There was abrupt occlusion of the previously placed stent in its superior half.  This caused very sluggish flow through the veins  initially.  Mechanical thrombectomy resulted in improved patency of the veins with persistent occlusion of the common iliac vein stent.  After 12 mm balloon angioplasty, antegrade flow through the stent was restored with focal area of stenosis noted in the distal third of the stent approaching 80% in caliber.  Due to suboptimal response with balloon angioplasty, decision was made to place  a new overlapping intravascular stent.  The 14 x 60 mm stent was positioned with slight more superior extension to the iliac venous confluence compared to the previously placed stent.  This acted to improve patency of the superior common iliac vein with only mild degree of residual stenosis present of approximately 20 - 25%.  Additional areas of venous narrowing in the femoral vein and popliteal vein were treated successfully with 8 mm balloon angioplasty with improved patency.  Nitroglycerin was administered during the procedure due to spasm around the indwelling guidewire, particularly at the level of the common femoral vein and external iliac vein.  This did improve enough to allow wire removal.  IMPRESSION: Successful completion of left lower extremity thrombolytic therapy. Residual nonocclusive thrombus was treated with mechanical thrombectomy and balloon angioplasty.  There was evidence of complete occlusion at the level of the indwelling common iliac stent.  This was treated with mechanical thrombectomy, balloon angioplasty and ultimately placement of a second overlapping stent with much improved patency.  Original Report Authenticated By: Reola Calkins, M.D.    Medications: Scheduled Meds:    . fondaparinux (ARIXTRA) injection  7.5 mg Subcutaneous Daily  . sodium chloride  3 mL Intravenous Q12H   Continuous Infusions:    . sodium chloride 125 mL/hr at 08/15/11 1616  . heparin Stopped (08/16/11 0945)   PRN Meds:.sodium chloride, acetaminophen, cyclobenzaprine, fentaNYL, midazolam, morphine injection,  sodium chloride  Assessment/Plan:   Principal Problem:   *Left leg DVT  - recurrence of left lower extremity DVT  - s/p lysis/stent/thrombectomy in IR for left lower extremity DVT with followup study 08/12/11 completing his thrombolytic therapy with good results. - to start Arixtra today per Dr. Gustavo Lah recommendation, pharmacy managing transition from heparin to Arixtra today, I paged Dr.Ennever to see if he also plans to start xarelto per note from 1/4 Await call back RN instructed to teach arixtra administration  Coagulopathy  - perhaps protein C deficiency versus lupus anticoagulant  Anticoagulation as above     LOS: 9 days   Keina Mutch 08/16/2011, 12:23 PM  [facility] Pager: [phone]

## 2011-08-16 NOTE — Progress Notes (Signed)
Doing well. He just came of parenteral heparin and was started on Arixtra injections this morning. Eventual plan is to go on oral Xarelto, one of the newly approved anti-X A. inhibitors. This drug has many advantages over Coumadin. He reports no dyspnea or chest pain. He is ambulating and tolerating this well without any recurrent swelling or pain of his left leg. On exam vital signs are stable. Lungs are clear and resonant to percussion. Regular cardiac rhythm no murmur. There is persistent asymmetric stainless swelling of the left calf. CBC stable mild anemia. Impression: Idiopathic extensive left iliac vein thrombosis with extension of clot while on therapeutic Coumadin and Lovenox now status post thrombolytic therapy, mechanical thrombectomy, and placement of a venous stent. Recommendation: Anticipate he will be discharged  Soon on oral Xarelto. The drug is very rapid onset of action in his therapeutic within 6 hours. He will return to [place] to continue his studies at [facility]. Please note that the lupus anticoagulant test is erroneously  Positive due to the fact that it was done while he was on a heparin product. He had a borderline elevation of anticardiolipin antibody against IgA which is nonspecific and not pathologic. No detectable antibodies against beta-2 glycoprotein 1 which is the more specific test for the presence of antiphospholipid antibodies. I do not see results reported yet for the factor V Leiden gene mutation or the prothrombin gene mutation.

## 2011-08-17 LAB — CULTURE, BLOOD (ROUTINE X 2)
Culture  Setup Time: 201212311413
Culture: NO GROWTH

## 2011-08-17 LAB — CBC
HCT: 34.5 % — ABNORMAL LOW (ref 39.0–52.0)
Hemoglobin: 11.6 g/dL — ABNORMAL LOW (ref 13.0–17.0)
MCH: 29.2 pg (ref 26.0–34.0)
MCHC: 33.6 g/dL (ref 30.0–36.0)
MCV: 86.9 fL (ref 78.0–100.0)
Platelets: 241 K/uL (ref 150–400)
RBC: 3.97 MIL/uL — ABNORMAL LOW (ref 4.22–5.81)
RDW: 13.1 % (ref 11.5–15.5)
WBC: 6.3 K/uL (ref 4.0–10.5)

## 2011-08-17 MED ORDER — RIVAROXABAN 10 MG PO TABS: 15.0000 mg | ORAL_TABLET | Freq: Two times a day (BID) | ORAL | Status: DC

## 2011-08-17 MED ORDER — FONDAPARINUX SODIUM 7.5 MG/0.6ML ~~LOC~~ SOLN: 7.5000 mg | Freq: Every day | SUBCUTANEOUS | Status: DC

## 2011-09-10 NOTE — Discharge Summary (Signed)
Physician Discharge Summary  Patient ID: Willie Charles MRN: [id number] DOB/AGE: 1990-10-03 20 y.o.  Admit date: 08/07/2011 Discharge date: 09/10/2011  Primary Care Physician:  No primary provider on file.   Discharge Diagnoses:    1. 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 questionable protein C deficiency 4. questionable positive lupus anticoagulant   Medication List  As of 09/10/2011  6:17 PM   STOP taking these medications         aspirin 81 MG chewable tablet      enoxaparin 80 MG/0.8ML Soln injection      PRESCRIPTION MEDICATION      warfarin 5 MG tablet         TAKE these medications         cyclobenzaprine 5 MG tablet   Commonly known as: FLEXERIL   Take 1 tablet (5 mg total) by mouth 3 (three) times daily as needed for muscle spasms.      rivaroxaban 10 MG Tabs tablet   Commonly known as: XARELTO   Take 1.5 tablets (15 mg total) by mouth 2 (two) times daily.             Disposition and Follow-up:  Hematologist in 2 weeks  Consults:   1. Dr. Arlan Organ with hematology  2. Dr. Jaynie Collins with vascular surgery 3. interventional radiology   Significant Diagnostic Studies:  Ir Veno/ext/uni Left  08/07/2011  *RADIOLOGY REPORT*  Indication: Recurrent left lower extremity DVT  LEFT LOWER EXTERMITY VENOGRAM AND PLACEMENT OF INFUSION CATHETER ULTRASOUND GUIDANCE FOR VASCULAR ACCESS  Comparisons: Left lower extremity venous lysis, angioplasty and stent placement - 08/03/2011  Impression:  1.  Extensive occlusive thrombus extending from the level of the left popliteal vein through the central aspect of the previously placed left common iliac vein stent.  The IVC is widely patent.  2. 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  3.  Successful placement of an infusion catheter with sideports extending from the vascular sheath tip to the central aspect of  the left common iliac vein stent.  3.  The patient was transferred to ICU for overnight TNKase infusion and will return tomorrow morning for repeat venogram and possible intervention.  Original Report Authenticated By: Waynard Reeds, M.D.   Ir Transcath/rx/inf  08/07/2011  *RADIOLOGY REPORT*  Indication: Recurrent left lower extremity DVT  LEFT LOWER EXTERMITY VENOGRAM AND PLACEMENT OF INFUSION CATHETER ULTRASOUND GUIDANCE FOR VASCULAR ACCESS    Impression:  1.  Extensive occlusive thrombus extending from the level of the left popliteal vein through the central aspect of the previously placed left common iliac vein stent.  The IVC is widely patent.  2. 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  3.  Successful placement of an infusion catheter with sideports extending from the vascular sheath tip to the central aspect of the left common iliac vein stent.  3.  The patient was transferred to ICU for overnight TNKase infusion and will return tomorrow morning for repeat venogram and possible intervention.  Original Report Authenticated By: Waynard Reeds, M.D.   Ir US Guide Vasc Access Left 08/07/2011  *RADIOLOGY REPORT*  Indication: Recurrent left lower extremity DVT  LEFT LOWER EXTERMITY VENOGRAM AND PLACEMENT OF INFUSION CATHETER ULTRASOUND GUIDANCE FOR VASCULAR ACCESS    Impression:  1.  Extensive occlusive thrombus extending from the level of  the left popliteal vein through the central aspect of the previously placed left common iliac vein stent.  The IVC is widely patent.  2. 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  3.  Successful placement of an infusion catheter with sideports extending from the vascular sheath tip to the central aspect of the left common iliac vein stent.  3.  The patient was transferred to ICU for overnight TNKase infusion and will return  tomorrow morning for repeat venogram and possible intervention.  Original Report Authenticated By: Waynard Reeds, M.D.    Brief H and P:Patient is a 21 y.o. Male  Presented to ER with left LE pain and swelling that he first noticed approximately one week prior to admission and has been getting progressively worse. He describes pain as sharp, non radiating, with no specific aggravating or alleviating factors, 7/10 in severity  Hospital Course:  1. Left leg DVT with Coagulopathy: He was seen by vascular surgery, interventional radiology and hematology for this. Originally  treated with heparin and leg elevation,  On 12/20 10/29/2010 he had a venogram which showed common femoral vein and iliac DVT with stenosis at the confluence with IVC (May Turner syndrome) subsequently underwent a mechanical and pharmacological lysis per IR he tolerated the procedure well, and had to be prematurely stopped due to abnormal INR level. Subsequentially underwent a followup lower extremity venogram on 12/20 03/31/2011 which showed persistent occlusive thrombus in the left thigh and left iliac vein  with a patent IVC. Marland Kitchen subsequently on 08/10/11 started catheter directed thrombolytic therapy with tenecteplase and concomitantly treated with IV heparin in the ICU per interventional radiology, was monitored in the ICU for this Then on 08/12/2011 underwent a followup venogram with venous mechanical thrombectomy, venous angioplasty and intravenous stent placement in the left common iliac vein with an overlapping to the previous stent. Continued on IV heparin and then transferred out of the ICU. He was continued on IV heparin for 5 days postprocedure, subsequently started on the Arixtra protocol by pharmacy per Dr. Tama Gander recommendations. He is discharged home on the Arixtra with a plan to eventually switched to PO Xarelto in 3 weeks per hematology recommendations. Also advised to follow up with hematologist in [place], where he'll  need to have his lupus anticoagulant and protein C levels repeated.  Time spent on Discharge:  Signed: [name] [facility]  09/10/2011, 6:17 PM

## 2011-09-29 ENCOUNTER — Telehealth: Payer: Self-pay | Admitting: Hematology & Oncology

## 2011-09-29 NOTE — Telephone Encounter (Signed)
Faxed records to [facility] @ Dr. Patty Sermons office in [place] per phone request

## 2011-10-29 ENCOUNTER — Other Ambulatory Visit: Payer: Self-pay | Admitting: Hematology & Oncology

## 2011-10-29 DIAGNOSIS — I82402 Acute embolism and thrombosis of unspecified deep veins of left lower extremity: Secondary | ICD-10-CM

## 2011-10-30 ENCOUNTER — Telehealth: Payer: Self-pay | Admitting: Hematology & Oncology

## 2011-10-30 NOTE — Telephone Encounter (Signed)
Pt aware of 3-25 doppler at [facility] 1015 am at admitting

## 2011-11-03 ENCOUNTER — Ambulatory Visit (HOSPITAL_COMMUNITY)
Admission: RE | Admit: 2011-11-03 | Discharge: 2011-11-03 | Disposition: A | Discharge status: Home / Self Care | Payer: BC Managed Care – PPO | Source: Ambulatory Visit | Attending: Hematology & Oncology | Admitting: Hematology & Oncology

## 2011-11-03 DIAGNOSIS — Z86718 Personal history of other venous thrombosis and embolism: Secondary | ICD-10-CM

## 2011-11-03 DIAGNOSIS — I82402 Acute embolism and thrombosis of unspecified deep veins of left lower extremity: Secondary | ICD-10-CM

## 2011-11-03 DIAGNOSIS — M79609 Pain in unspecified limb: Secondary | ICD-10-CM | POA: Insufficient documentation

## 2011-11-03 NOTE — Progress Notes (Signed)
Left lower extremity venous duplex completed at 10:40.  Preliminary report is positive for DVT in the left common femoral and femoral veins.  Negative for DVT in the right common femoral vein.

## 2011-11-06 ENCOUNTER — Other Ambulatory Visit: Payer: Self-pay | Admitting: *Deleted

## 2011-11-06 ENCOUNTER — Ambulatory Visit (HOSPITAL_BASED_OUTPATIENT_CLINIC_OR_DEPARTMENT_OTHER): Payer: BC Managed Care – PPO | Admitting: Hematology & Oncology

## 2011-11-06 VITALS — BP 109/65 | HR 92 | Temp 96.8°F | Ht 72.0 in | Wt 125.0 lb

## 2011-11-06 DIAGNOSIS — I82402 Acute embolism and thrombosis of unspecified deep veins of left lower extremity: Secondary | ICD-10-CM

## 2011-11-06 DIAGNOSIS — I824Y9 Acute embolism and thrombosis of unspecified deep veins of unspecified proximal lower extremity: Secondary | ICD-10-CM

## 2011-11-06 NOTE — Progress Notes (Signed)
This office note has been dictated.

## 2011-11-06 NOTE — Progress Notes (Signed)
DIAGNOSES: 1. 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 2. May-Thurner syndrome. 3. Possible lupus anticoagulant. 4. Mildly elevated IgA anticardiolipin antibody.  CURRENT THERAPY:  Xarelto 20 mg p.o. daily.  INTERVAL HISTORY:  Mr. Maura comes in for his 1st office visit.  I saw him initially back in December, as he was readmitted because of recurrent iliofemoral thrombosis of his left leg.  He does have the May- Thurner abnormality.  He underwent stenting, he underwent mechanical thrombectomy.  He had a thrombolytic therapy performed.  He did quite well with this.  At the end of the hospitalization, a followup venogram did show some residual thrombus in the left medial femoral vein.  He has been out in [place] at [facility].  He has compression stocking that he has not been wearing because he has felt well.  He decided to go running.  He went running and he developed swelling in the left lower leg.  He does have a hematologist out in [place].  He did have a Doppler done up there. I remember the report coming across my desk, but of course, it is not yet scanned into the Epic system.  From what the report stated, there was "acute thrombus.  He came back home.  We did have another Doppler done.  This was compared to what he had done previously.  This showed partial chronic thrombus in the left common femoral vein.  He still he had blood flow.  He says his leg feels okay.  He just wants to get back to running again. I told him that he is going to have to wear his compression stocking whenever he does any significant exertion, probably for another 6 months at least.  I think he just was walking around on campus, he probably does not need to wear the stocking.  I still do worry about postphlebitic syndrome with Mr. Bress.  That is why I want to try to be aggressive when he is exercising.  He has had no problem with cough or shortness of breath.  He is doing all  right on the Xarelto.  He goes back to [place] on Easter Sunday.  PHYSICAL EXAM:  General: This is a thin white gentleman in no obvious distress.  Vital signs: Temperature 96, pulse 92, heart rate 18, blood pressure 109/65, and weight is 125.  Head and neck exam shows a normocephalic, atraumatic skull.  There are no ocular or oral lesions. There are no palpable cervical or supraclavicular lymph nodes.  Lungs are clear to percussion and auscultation bilaterally.  Cardiac examination:  Regular rate and rhythm with a normal S1 and S2.  There are no murmurs, rubs, or bruits.  Abdominal exam: Soft with good bowel sounds.  There is no palpable abdominal mass.  There is no fluid wave. There is no palpable hepatosplenomegaly.  Extremities: Shows minimal nonpitting edema of the left lower leg.  He has no venous cord in his legs.  He has a negative Homan sign with his left leg.  He has good range of motion of his joints.  I, again, cannot palpate any venous cord in his signs, or calf region.  Neurological exam:  No focal neurological deficits.  IMPRESSION:  Mr. Zeitler is a 21 year old gentleman with a recurrent iliofemoral thrombus of the left leg.  He underwent thrombolytic therapy, mechanical thrombectomy, stent placement, and angioplasty.  The radiologists were very aggressive in trying to open up his lower leg venous return.  It is still unclear  whether or not there is a hypercoagulable factor here.  We are going to have to do studies on him when he does get back in town.  He says he will be back in May.  Until then, I told him that he needs to be aggressive with wearing his compression stocking when he is exercising.  I told him I would like him to give me a call at anytime if there are issues that he has out there in [place].  I am not sure if he is truly positive for lupus anticoagulant.  I will recheck this when he gets back in town.  I will also recheck his protein S and protein C  studies.  I will also recheck his prothrombin II gene mutation.  I do want to get another Doppler on him when he does return, for comparison with his prior studies appeared.  I spent a good hour with him.  I just want to make sure we gave him a good evaluation and make sure that we do everything we can to minimize his risk for postphlebitic syndrome.    ______________________________ [name], M.D. PRE/MEDQ  D:  11/06/2011  T:  11/06/2011  Job:  [id number]

## 2011-11-07 ENCOUNTER — Telehealth: Payer: Self-pay | Admitting: Hematology & Oncology

## 2011-11-07 ENCOUNTER — Other Ambulatory Visit: Payer: Self-pay | Admitting: *Deleted

## 2011-11-07 DIAGNOSIS — D689 Coagulation defect, unspecified: Secondary | ICD-10-CM

## 2011-11-07 DIAGNOSIS — I82402 Acute embolism and thrombosis of unspecified deep veins of left lower extremity: Secondary | ICD-10-CM

## 2011-11-07 NOTE — Telephone Encounter (Signed)
Pt aware of 5-21 doppler and 5-22 MD

## 2011-11-07 NOTE — Progress Notes (Signed)
Clarification of previous order entered by Dr Myna Hidalgo yesterday.

## 2011-12-30 ENCOUNTER — Ambulatory Visit (HOSPITAL_COMMUNITY)
Admission: RE | Admit: 2011-12-30 | Discharge: 2011-12-30 | Disposition: A | Discharge status: Home / Self Care | Payer: BC Managed Care – PPO | Source: Ambulatory Visit | Attending: Hematology & Oncology | Admitting: Hematology & Oncology

## 2011-12-30 DIAGNOSIS — I82409 Acute embolism and thrombosis of unspecified deep veins of unspecified lower extremity: Secondary | ICD-10-CM

## 2011-12-30 DIAGNOSIS — M79609 Pain in unspecified limb: Secondary | ICD-10-CM | POA: Insufficient documentation

## 2011-12-30 DIAGNOSIS — D689 Coagulation defect, unspecified: Secondary | ICD-10-CM

## 2011-12-30 DIAGNOSIS — Z86718 Personal history of other venous thrombosis and embolism: Secondary | ICD-10-CM | POA: Insufficient documentation

## 2011-12-30 DIAGNOSIS — I82402 Acute embolism and thrombosis of unspecified deep veins of left lower extremity: Secondary | ICD-10-CM

## 2011-12-30 NOTE — Progress Notes (Signed)
VASCULAR LAB PRELIMINARY  PRELIMINARY  PRELIMINARY  PRELIMINARY  Left lower extremity venous duplex  completed.    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  There is evidence of improvement since last exam done March 2013.  Final report has more in depth detail. Vanna Scotland, 12/30/2011, 3:53 PM

## 2011-12-31 ENCOUNTER — Ambulatory Visit (HOSPITAL_BASED_OUTPATIENT_CLINIC_OR_DEPARTMENT_OTHER): Payer: BC Managed Care – PPO | Admitting: Hematology & Oncology

## 2011-12-31 ENCOUNTER — Other Ambulatory Visit (HOSPITAL_BASED_OUTPATIENT_CLINIC_OR_DEPARTMENT_OTHER): Payer: BC Managed Care – PPO | Admitting: Lab

## 2011-12-31 VITALS — BP 109/77 | HR 94 | Temp 97.7°F | Ht 70.0 in | Wt 121.0 lb

## 2011-12-31 DIAGNOSIS — I824Y9 Acute embolism and thrombosis of unspecified deep veins of unspecified proximal lower extremity: Secondary | ICD-10-CM

## 2011-12-31 DIAGNOSIS — I82402 Acute embolism and thrombosis of unspecified deep veins of left lower extremity: Secondary | ICD-10-CM

## 2011-12-31 LAB — CBC WITH DIFFERENTIAL (CANCER CENTER ONLY)
BASO#: 0 10e3/uL (ref 0.0–0.2)
BASO%: 0.4 % (ref 0.0–2.0)
EOS%: 3.5 % (ref 0.0–7.0)
Eosinophils Absolute: 0.2 10e3/uL (ref 0.0–0.5)
HCT: 44.7 % (ref 38.7–49.9)
HGB: 15.6 g/dL (ref 13.0–17.1)
LYMPH#: 1.4 10e3/uL (ref 0.9–3.3)
LYMPH%: 30 % (ref 14.0–48.0)
MCH: 29.2 pg (ref 28.0–33.4)
MCHC: 34.9 g/dL (ref 32.0–35.9)
MCV: 84 fL (ref 82–98)
MONO#: 0.5 10e3/uL (ref 0.1–0.9)
MONO%: 11.7 % (ref 0.0–13.0)
NEUT#: 2.5 10e3/uL (ref 1.5–6.5)
NEUT%: 54.4 % (ref 40.0–80.0)
Platelets: 265 10e3/uL (ref 145–400)
RBC: 5.34 10e6/uL (ref 4.20–5.70)
RDW: 13 % (ref 11.1–15.7)
WBC: 4.6 10e3/uL (ref 4.0–10.0)

## 2011-12-31 NOTE — Progress Notes (Signed)
This office note has been dictated.

## 2012-01-01 NOTE — Progress Notes (Signed)
DIAGNOSES: 1. 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 2. May-Thurner syndrome.  CURRENT THERAPY:  Xarelto 20 mg p.o. daily.  INTERIM HISTORY:  Willie Charles comes in for a followup.  He has returned from college for the summer.  He goes to [employer] in [place]. Since we last saw him, he has been doing okay.  He is still not back to running.  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  I did tell him that if he is going to go back to running, that he is going to have to wear the compression stocking on his left leg.  We did go ahead and repeat a Doppler test of his left leg.  The Doppler test was done at the Vascular Lab at [facility].  This did show continued improvement in the chronic thrombus in the left leg.  He has increased recanalization.  He has had no bleeding.  He has had no change in bowel or bladder habits.  His appetite is doing okay.  He has had no cough or shortness breath.  He has had no headache.  There has been no fever.  PHYSICAL EXAMINATION:  This is a thin, white gentleman in no obvious distress.  Vital signs:  Temperature 97.7, pulse 94, respiratory rate 18, blood pressure is 109/77.  Weight is 121.  Head and neck: Normocephalic, atraumatic skull.  There are no ocular or oral lesions. There are no palpable cervical or supraclavicular lymph nodes.  Lungs: Clear bilaterally.  Cardiac:  Regular rate and rhythm with a normal S1 and S2.  There are no murmurs, rubs or bruits.  Abdomen:  Soft with good bowel sounds.  There is no palpable abdominal mass.  There is no fluid wave.  No palpable hepatosplenomegaly.  Back: No tenderness over the spine, ribs, or hips.  Extremities:  Minimal nonpitting edema of the left lower leg.  He has no palpable venous cord in his left lower leg. He has a negative Homans' sign.  He has good pulses in his feet.  LABORATORY STUDIES:  White cell count of 4.6, hemoglobin 15.6, hemoglobin 44.7, platelet count  265.  IMPRESSION:  Mr. Nguyenthi is a 21 year old white gentleman with a recurrent iliofemoral thrombus of the left leg.  He was found to have the May-Thurner syndrome. He is on Xarelto now.  His left leg looks really good.  As such, I really do not think that he needs to inhibit himself from exercising.  I did tell him that he has to pace himself but he must use the compression stocking.  We are repeating certainly his coagulation studies.  Now that he is off Coumadin, we will have a good sense as to if there is any obvious thrombophilic state.  I do want to get another Doppler of his leg in December.  He will be back in the middle part of the month from school and I think it would be nice to recheck his left leg to see if he is having continued improvement in recanalization.  I will plan to see Mr. Evetts after he returns from school for Christmas vacation.    ______________________________ [name], M.D. PRE/MEDQ  D:  12/31/2011  T:  01/01/2012  Job:  [id number]

## 2012-01-07 LAB — HYPERCOAGULABLE PANEL, COMPREHENSIVE
AntiThromb III Func: 111 % (ref 76–126)
Anticardiolipin IgA: 14 APL U/mL (ref <22)
Anticardiolipin IgG: 13 GPL U/mL (ref <23)
Anticardiolipin IgM: 6 MPL U/mL (ref <11)
Beta-2 Glyco I IgG: 3 G Units (ref <20)
Beta-2-Glycoprotein I IgA: 15 A Units (ref <20)
Beta-2-Glycoprotein I IgM: 5 M Units (ref <20)
DRVVT: 44.6 secs (ref <45.1)
Lupus Anticoagulant: NOT DETECTED
PTT Lupus Anticoagulant: 47.3 secs — ABNORMAL HIGH (ref 28.0–43.0)
PTTLA 4:1 Mix: 39.7 secs (ref 28.0–43.0)
Protein C Activity: 110 % (ref 75–133)
Protein C, Total: 63 % — ABNORMAL LOW (ref 72–160)
Protein S Activity: 105 % (ref 69–129)
Protein S Total: 100 % (ref 60–150)

## 2012-01-07 LAB — D-DIMER, QUANTITATIVE: D-Dimer, Quant: 0.22 ug/mL-FEU (ref 0.00–0.48)

## 2012-01-16 ENCOUNTER — Other Ambulatory Visit: Payer: Self-pay | Admitting: Hematology & Oncology

## 2012-01-21 ENCOUNTER — Other Ambulatory Visit: Payer: Self-pay | Admitting: *Deleted

## 2012-01-22 ENCOUNTER — Other Ambulatory Visit: Payer: Self-pay | Admitting: *Deleted

## 2012-01-22 ENCOUNTER — Telehealth: Payer: Self-pay | Admitting: Hematology & Oncology

## 2012-01-22 DIAGNOSIS — I82402 Acute embolism and thrombosis of unspecified deep veins of left lower extremity: Secondary | ICD-10-CM

## 2012-01-22 DIAGNOSIS — D689 Coagulation defect, unspecified: Secondary | ICD-10-CM

## 2012-01-22 NOTE — Telephone Encounter (Signed)
Pt aware of 12-16 and 12-18 appointments

## 2012-01-22 NOTE — Telephone Encounter (Signed)
Left detailed message with 12-16 doppler and 12-18 MD appointments

## 2012-02-18 ENCOUNTER — Other Ambulatory Visit: Payer: Self-pay | Admitting: *Deleted

## 2012-02-18 ENCOUNTER — Telehealth: Payer: Self-pay | Admitting: Hematology & Oncology

## 2012-02-18 DIAGNOSIS — D689 Coagulation defect, unspecified: Secondary | ICD-10-CM

## 2012-02-18 DIAGNOSIS — I82402 Acute embolism and thrombosis of unspecified deep veins of left lower extremity: Secondary | ICD-10-CM

## 2012-02-18 NOTE — Progress Notes (Signed)
Pt's mother called yesterday stating he recently took a plane ride to [place] without incident but when he sat for a 4 - 6 hour graduation ceremony he developed recurrent left lower leg redness, swelling, & erythema. He currently has to wear his TED hose all the time to keep the swelling down. He is currently on Xarelto 20 mg daily. She stated "we have been trying to baby it to see if the swelling would go down on its own but it hasn't. Could he maybe double the dose of Xarelto?" Explained to her that Dr Myna Hidalgo was off Tues afternoon but that if his sx worsened or if he developed SOB, chest tightness/heaviness he would need to go to the ER ASAP. Informed her that Dr Myna Hidalgo will most likely want to have another doppler done on his leg and they should not make any changes to his medication at this time. She verbalized understanding of all of the instructions.  Reviewed above with Dr Myna Hidalgo on 02/18/12. He ordered left lower ext doppler to be done ASAP. (Of note - pt's mother said this time last year there was an issue with her husband's insurance because his employer forgot to renew it and she is afraid it could happen again therefore, she'd like to have it done before the 12th. Orders entered and request sent to [facility] to schedule. Pt's mother made aware.

## 2012-02-18 NOTE — Telephone Encounter (Signed)
Pt aware of 7-11 doppler

## 2012-02-19 ENCOUNTER — Other Ambulatory Visit: Payer: Self-pay | Admitting: Hematology & Oncology

## 2012-02-19 ENCOUNTER — Ambulatory Visit (HOSPITAL_BASED_OUTPATIENT_CLINIC_OR_DEPARTMENT_OTHER)
Admission: RE | Admit: 2012-02-19 | Discharge: 2012-02-19 | Disposition: A | Discharge status: Home / Self Care | Payer: BC Managed Care – PPO | Source: Ambulatory Visit | Attending: Hematology & Oncology | Admitting: Hematology & Oncology

## 2012-02-19 DIAGNOSIS — Z7901 Long term (current) use of anticoagulants: Secondary | ICD-10-CM | POA: Insufficient documentation

## 2012-02-19 DIAGNOSIS — I82402 Acute embolism and thrombosis of unspecified deep veins of left lower extremity: Secondary | ICD-10-CM

## 2012-02-19 DIAGNOSIS — I82409 Acute embolism and thrombosis of unspecified deep veins of unspecified lower extremity: Secondary | ICD-10-CM

## 2012-02-23 ENCOUNTER — Other Ambulatory Visit: Payer: Self-pay | Admitting: Hematology & Oncology

## 2012-02-23 DIAGNOSIS — I82409 Acute embolism and thrombosis of unspecified deep veins of unspecified lower extremity: Secondary | ICD-10-CM

## 2012-02-24 ENCOUNTER — Other Ambulatory Visit: Payer: Self-pay | Admitting: Hematology & Oncology

## 2012-02-24 ENCOUNTER — Other Ambulatory Visit (HOSPITAL_COMMUNITY): Payer: Self-pay | Admitting: Interventional Radiology

## 2012-02-24 ENCOUNTER — Encounter (HOSPITAL_COMMUNITY): Payer: Self-pay

## 2012-02-24 ENCOUNTER — Other Ambulatory Visit: Payer: Self-pay | Admitting: Radiology

## 2012-02-24 ENCOUNTER — Inpatient Hospital Stay (HOSPITAL_COMMUNITY)
Admission: RE | Admit: 2012-02-24 | Discharge: 2012-02-27 | DRG: 550 | Disposition: A | Discharge status: Home / Self Care | Payer: BC Managed Care – PPO | Source: Ambulatory Visit | Attending: Pulmonary Disease | Admitting: Pulmonary Disease

## 2012-02-24 VITALS — BP 120/60 | HR 73 | Temp 98.3°F | Resp 20 | Ht 72.0 in | Wt 122.4 lb

## 2012-02-24 DIAGNOSIS — D689 Coagulation defect, unspecified: Secondary | ICD-10-CM | POA: Diagnosis present

## 2012-02-24 DIAGNOSIS — Z888 Allergy status to other drugs, medicaments and biological substances status: Secondary | ICD-10-CM

## 2012-02-24 DIAGNOSIS — Y92009 Unspecified place in unspecified non-institutional (private) residence as the place of occurrence of the external cause: Secondary | ICD-10-CM

## 2012-02-24 DIAGNOSIS — I82402 Acute embolism and thrombosis of unspecified deep veins of left lower extremity: Secondary | ICD-10-CM

## 2012-02-24 DIAGNOSIS — I82409 Acute embolism and thrombosis of unspecified deep veins of unspecified lower extremity: Secondary | ICD-10-CM

## 2012-02-24 DIAGNOSIS — Z7901 Long term (current) use of anticoagulants: Secondary | ICD-10-CM

## 2012-02-24 DIAGNOSIS — Y832 Surgical operation with anastomosis, bypass or graft as the cause of abnormal reaction of the patient, or of later complication, without mention of misadventure at the time of the procedure: Secondary | ICD-10-CM | POA: Diagnosis present

## 2012-02-24 DIAGNOSIS — I824Y9 Acute embolism and thrombosis of unspecified deep veins of unspecified proximal lower extremity: Secondary | ICD-10-CM | POA: Diagnosis present

## 2012-02-24 DIAGNOSIS — Z86718 Personal history of other venous thrombosis and embolism: Secondary | ICD-10-CM

## 2012-02-24 DIAGNOSIS — D720 Genetic anomalies of leukocytes: Secondary | ICD-10-CM | POA: Diagnosis present

## 2012-02-24 DIAGNOSIS — D6859 Other primary thrombophilia: Secondary | ICD-10-CM | POA: Diagnosis present

## 2012-02-24 DIAGNOSIS — T82898A Other specified complication of vascular prosthetic devices, implants and grafts, initial encounter: Principal | ICD-10-CM | POA: Diagnosis present

## 2012-02-24 LAB — CBC
HCT: 46.7 % (ref 39.0–52.0)
HCT: 42.3 % (ref 39.0–52.0)
Hemoglobin: 16.2 g/dL (ref 13.0–17.0)
Hemoglobin: 14.7 g/dL (ref 13.0–17.0)
MCH: 29.5 pg (ref 26.0–34.0)
MCHC: 34.8 g/dL (ref 30.0–36.0)
MCV: 85.7 fL (ref 78.0–100.0)
MCV: 84.8 fL (ref 78.0–100.0)
RBC: 5.45 MIL/uL (ref 4.22–5.81)
WBC: 5 10*3/uL (ref 4.0–10.5)

## 2012-02-24 LAB — BASIC METABOLIC PANEL
CO2: 28 mEq/L (ref 19–32)
Chloride: 103 mEq/L (ref 96–112)
Creatinine, Ser: 0.87 mg/dL (ref 0.50–1.35)
GFR calc Af Amer: >90 mL/min (ref >90)
Potassium: 4.1 mEq/L (ref 3.5–5.1)
Sodium: 141 mEq/L (ref 135–145)

## 2012-02-24 LAB — APTT: aPTT: 38 seconds — ABNORMAL HIGH (ref 24–37)

## 2012-02-24 LAB — MRSA PCR SCREENING: MRSA by PCR: NEGATIVE

## 2012-02-24 MED ORDER — SODIUM CHLORIDE 0.9 % IV SOLN
INTRAVENOUS | Status: DC
  Administered 2012-02-25: 03:00:00 via INTRAVENOUS
  Administered 2012-02-27: 1000 mL via INTRAVENOUS

## 2012-02-24 MED ORDER — MORPHINE SULFATE 4 MG/ML IJ SOLN: 5.0000 mg | INTRAMUSCULAR | Status: DC | PRN

## 2012-02-24 MED ORDER — IOHEXOL 300 MG/ML  SOLN
250.0000 mL | Freq: Once | INTRAMUSCULAR | Status: AC | PRN
  Administered 2012-02-24: 100 mL via INTRAVENOUS

## 2012-02-24 MED ORDER — SODIUM CHLORIDE 0.9 % IV SOLN
INTRAVENOUS | Status: AC | PRN
  Administered 2012-02-24: 50 mL/h via INTRAVENOUS

## 2012-02-24 MED ORDER — TENECTEPLASE 50 MG IV KIT
0.5000 mg/h | PACK | INTRAVENOUS | Status: DC
  Administered 2012-02-24 – 2012-02-26 (×8): 0.5 mg/h via INTRAVENOUS
  Filled 2012-02-24 (×9): qty 0.5

## 2012-02-24 MED ORDER — MORPHINE SULFATE 2 MG/ML IJ SOLN
5.0000 mg | INTRAMUSCULAR | Status: DC | PRN
  Administered 2012-02-24: 5 mg via INTRAVENOUS
  Administered 2012-02-25 – 2012-02-26 (×2): 2 mg via INTRAVENOUS
  Administered 2012-02-26: 4 mg via INTRAVENOUS
  Administered 2012-02-26: 2 mg via INTRAVENOUS
  Filled 2012-02-24 (×3): qty 1
  Filled 2012-02-24: qty 3
  Filled 2012-02-24: qty 2

## 2012-02-24 MED ORDER — FENTANYL CITRATE 0.05 MG/ML IJ SOLN
INTRAMUSCULAR | Status: AC
  Administered 2012-02-24: 50 ug
  Filled 2012-02-24: qty 4

## 2012-02-24 MED ORDER — SODIUM CHLORIDE 0.9 % IV SOLN
Freq: Once | INTRAVENOUS | Status: AC
  Administered 2012-02-24: 1000 mL via INTRAVENOUS

## 2012-02-24 MED ORDER — HEPARIN (PORCINE) IN NACL 100-0.45 UNIT/ML-% IJ SOLN
600.0000 [IU]/h | INTRAMUSCULAR | Status: DC
  Administered 2012-02-25: 600 [IU]/h via INTRAVENOUS
  Filled 2012-02-24 (×4): qty 250

## 2012-02-24 MED ORDER — ONDANSETRON HCL 4 MG/2ML IJ SOLN
4.0000 mg | Freq: Four times a day (QID) | INTRAMUSCULAR | Status: DC | PRN
  Administered 2012-02-25: 4 mg via INTRAVENOUS
  Filled 2012-02-24: qty 2

## 2012-02-24 MED ORDER — HEPARIN BOLUS VIA INFUSION
2000.0000 [IU] | Freq: Once | INTRAVENOUS | Status: AC
  Filled 2012-02-24: qty 2000

## 2012-02-24 MED ORDER — MIDAZOLAM HCL 5 MG/5ML IJ SOLN
INTRAMUSCULAR | Status: AC | PRN
  Administered 2012-02-24 (×4): 1 mg via INTRAVENOUS

## 2012-02-24 MED ORDER — HEPARIN SODIUM (PORCINE) 1000 UNIT/ML IJ SOLN
INTRAMUSCULAR | Status: AC
  Administered 2012-02-24: 2000 [IU] via INTRAVENOUS
  Filled 2012-02-24: qty 1

## 2012-02-24 MED ORDER — SODIUM CHLORIDE 0.9 % IJ SOLN: 3.0000 mL | Freq: Two times a day (BID) | INTRAMUSCULAR | Status: DC

## 2012-02-24 MED ORDER — FENTANYL CITRATE 0.05 MG/ML IJ SOLN
INTRAMUSCULAR | Status: AC | PRN
  Administered 2012-02-24 (×4): 50 ug via INTRAVENOUS

## 2012-02-24 MED ORDER — HEPARIN (PORCINE) IN NACL 100-0.45 UNIT/ML-% IJ SOLN
700.0000 [IU]/h | INTRAMUSCULAR | Status: DC
  Administered 2012-02-24: 700 [IU]/h via INTRAVENOUS
  Filled 2012-02-24: qty 250

## 2012-02-24 MED ORDER — MIDAZOLAM HCL 2 MG/2ML IJ SOLN
INTRAMUSCULAR | Status: AC
  Administered 2012-02-24: 1 mg
  Filled 2012-02-24: qty 4

## 2012-02-24 NOTE — Progress Notes (Signed)
ANTICOAGULATION CONSULT NOTE - Initial Consult  Pharmacy Consult for Heparin Indication: occluded L iliac stent, L femoral thrombus likely chronic; on TNKase  Allergies  Allergen Reactions  . Warfarin Sodium Other (See Comments)    Causes blood clotting    Patient Measurements: Height: 6' (182.9 cm) Weight: 130 lb (58.968 kg) IBW/kg (Calculated) : 77.6  Heparin Dosing Weight: 59 kg  Vital Signs: Temp: 97.6 F (36.4 C) (07/16 0843) Temp src: Oral (07/16 0843) BP: 109/62 mmHg (07/16 1305) Pulse Rate: 75  (07/16 1305)  Labs:  [facility] 02/24/12 1228 02/24/12 0828  HGB 14.7 16.2  HCT 42.3 46.7  PLT 251 294  APTT -- 38*  LABPROT -- --  INR -- --  HEPARINUNFRC >2.00* --  CREATININE -- 0.87  CKTOTAL -- --  CKMB -- --  TROPONINI -- --    Estimated Creatinine Clearance: 113 ml/min (by C-G formula based on Cr of 0.87).   Medical History: Past Medical History  Diagnosis Date  . No pertinent past medical history   . DVT (deep venous thrombosis)      Assessment: 21 yo M with h/o May-Thurner syndrome and prior LLE iliofemoral thrombus with previous thrombolysis and iliac stenting.  Had recurrent thrombus in femoral veins on left, and now s/p IR and thrombolysis to begin heparin infusion and TNKase, 2000 unit IV heparin bolus ordered per IR.  CBC okay.  Goal of Therapy:  Heparin level 0.2 - 0.5 Monitor platelets by anticoagulation protocol: Yes   Plan:  1.  Initiate heparin at 700 units/hr (~12 units/kg/hr) 2.  Check heparin level in 6 hours 3.  Q6 hr heparin level, daily CBC  Rolland Porter, Pharm.D., BCPS Clinical Pharmacist Pager: [phone] 02/24/2012,1:17 PM

## 2012-02-24 NOTE — H&P (Signed)
Chief Complaint: Recurrent (L)LE DVT Referring Physician:Ennever HPI: Willie Charles is an 21 y.o. male with hx of May-Thurner syndrome and prior (L)LE iliofemoral thrombus with previous thrombolysis and (L)iliac stenting. He has been on Xarelto and doing well but he recently developed some recurrent symptoms inlcuding fatigue and swelling of his (L)LE after activity. He has a repeat duplex which showed some recurrent thrombus in the femoral veins on the left. He is referred for Pelvic and LE venogram to reassess. He has otherwise been well.  Past Medical History:  Past Medical History  Diagnosis Date  . No pertinent past medical history   . DVT (deep venous thrombosis)     Past Surgical History:  Past Surgical History  Procedure Date  . Mouth surgery   . No past surgeries     Family History: No family history on file.  Social History:  reports that he has never smoked. He has never used smokeless tobacco. He reports that he drinks alcohol. He reports that he does not use illicit drugs.  Allergies:  Allergies  Allergen Reactions  . Warfarin Sodium Other (See Comments)    Causes blood clotting    Medications: Xarelto 20mg  daily  Please HPI for pertinent positives, otherwise complete 10 system ROS negative.  Physical Exam: Blood pressure 105/71, pulse 75, temperature 97.6 F (36.4 C), temperature source Oral, resp. rate 18, height 6' (1.829 m), weight 130 lb (58.968 kg), SpO2 100.00%. Body mass index is 17.63 kg/(m^2).   General Appearance:  Alert, cooperative, no distress, appears stated age, thin.  Head:  Normocephalic, without obvious abnormality, atraumatic  ENT: Unremarkable  Neck: Supple, symmetrical, trachea midline, no adenopathy, thyroid: not enlarged, symmetric, no tenderness/mass/nodules  Lungs:   Clear to auscultation bilaterally, no w/r/r, respirations unlabored without use of accessory muscles.  Chest Wall:  No tenderness or deformity  Heart:  Regular rate and  rhythm, S1, S2 normal, no murmur, rub or gallop. Carotids 2+ without bruit.  Abdomen:   Soft, non-tender, non distended. Bowel sounds active all four quadrants,  no masses, no organomegaly.  Extremities: Extremities normal. No appreciable edema of (L)LE  Pulses: 2+ and symmetric  Skin: Skin color, texture, turgor normal, no rashes or lesions  Neurologic: Normal affect, no gross deficits.   Labs:pending  Assessment/Plan May-Thurner syndrome with prior (L)iliac v stenting. Recurrent (L)femoral thrombus on Duplex. Discussed pelvic/LE venogram with pt and mother, including risks and complications. Discussed possibility of intervention today if findings necessitate. Labs pending Consent signed in chart.  Brayton El PA-C 02/24/2012, 8:57 AM

## 2012-02-24 NOTE — Procedures (Signed)
Pelvic venogram: occluded L iliac stent; initiated thrombolytic infusion No complication No blood loss. See complete dictation in [facility].

## 2012-02-24 NOTE — H&P (Signed)
L femoral thrombus likely chronic. Will assess for any hemodynamically significant iliac venous lesion.

## 2012-02-24 NOTE — Progress Notes (Signed)
ANTICOAGULATION CONSULT NOTE - Initial Consult  Pharmacy Consult for Heparin Indication: occluded L iliac stent, L femoral thrombus likely chronic; on TNKase  Allergies  Allergen Reactions  . Warfarin Sodium Other (See Comments)    Causes blood clotting    Patient Measurements: Height: 6' (182.9 cm) Weight: 130 lb (58.968 kg) IBW/kg (Calculated) : 77.6  Heparin Dosing Weight: 59 kg  Vital Signs: Temp: 98 F (36.7 C) (07/16 2000) Temp src: Oral (07/16 2000) BP: 120/62 mmHg (07/16 2100) Pulse Rate: 74  (07/16 2100)  Labs:  Basename 02/24/12 1930 02/24/12 1228 02/24/12 0828  HGB -- 14.7 16.2  HCT -- 42.3 46.7  PLT -- 251 294  APTT -- -- 38*  LABPROT -- -- --  INR -- -- --  HEPARINUNFRC 0.90* >2.00* --  CREATININE -- -- 0.87  CKTOTAL -- -- --  CKMB -- -- --  TROPONINI -- -- --    Estimated Creatinine Clearance: 113 ml/min (by C-G formula based on Cr of 0.87).   Medical History: Past Medical History  Diagnosis Date  . No pertinent past medical history   . DVT (deep venous thrombosis)      Assessment: 6 hour heparin level = 0.9 on Heparin IV 700 units/hr and TNKase infusion in this 21 yo M with h/o May-Thurner syndrome and prior LLE iliofemoral thrombus with previous thrombolysis and iliac stenting.  Had recurrent thrombus in femoral veins on left, and today is s/p IR and thrombolysis, thus began  heparin infusion and TNKase. Currentlyl no bleeding reported per the nurse. TNKase continues to infuse at 0.5mg /hr. Will hold the heparin drip x 1 hour then resume at lower rate.   Goal of Therapy:  Heparin level 0.2 - 0.5 Monitor platelets by anticoagulation protocol: Yes   Plan:  1. Stop heparin drip x 1 hour then resume heparin at 500 units/hr.  2.  Check heparin level in 6 hours 3.  Q6 hr heparin level, daily CBC  Noah Delaine, [place] Clinical Pharmacist [phone] 02/24/2012,9:05 PM

## 2012-02-25 ENCOUNTER — Inpatient Hospital Stay (HOSPITAL_COMMUNITY): Payer: BC Managed Care – PPO

## 2012-02-25 DIAGNOSIS — D689 Coagulation defect, unspecified: Secondary | ICD-10-CM

## 2012-02-25 DIAGNOSIS — I82409 Acute embolism and thrombosis of unspecified deep veins of unspecified lower extremity: Secondary | ICD-10-CM

## 2012-02-25 LAB — CBC
HCT: 37.2 % — ABNORMAL LOW (ref 39.0–52.0)
MCH: 29.6 pg (ref 26.0–34.0)
MCHC: 34.6 g/dL (ref 30.0–36.0)
MCHC: 34.7 g/dL (ref 30.0–36.0)
MCV: 84.5 fL (ref 78.0–100.0)
Platelets: 211 10*3/uL (ref 150–400)
Platelets: 195 10*3/uL (ref 150–400)
RDW: 12.4 % (ref 11.5–15.5)
WBC: 7.3 10*3/uL (ref 4.0–10.5)

## 2012-02-25 LAB — BASIC METABOLIC PANEL
BUN: 10 mg/dL (ref 6–23)
Calcium: 9.1 mg/dL (ref 8.4–10.5)
Creatinine, Ser: 0.75 mg/dL (ref 0.50–1.35)
GFR calc Af Amer: >90 mL/min (ref >90)
GFR calc non Af Amer: >90 mL/min (ref >90)

## 2012-02-25 LAB — FIBRINOGEN: Fibrinogen: 187 mg/dL — ABNORMAL LOW (ref 204–475)

## 2012-02-25 MED ORDER — SODIUM CHLORIDE 0.9 % IJ SOLN: 3.0000 mL | INTRAMUSCULAR | Status: DC | PRN

## 2012-02-25 MED ORDER — SODIUM CHLORIDE 0.9 % IJ SOLN
3.0000 mL | Freq: Two times a day (BID) | INTRAMUSCULAR | Status: DC
  Administered 2012-02-26: 3 mL via INTRAVENOUS

## 2012-02-25 MED ORDER — IOHEXOL 300 MG/ML  SOLN
150.0000 mL | Freq: Once | INTRAMUSCULAR | Status: AC | PRN
  Administered 2012-02-25: 100 mL via INTRAVENOUS

## 2012-02-25 MED ORDER — MIDAZOLAM HCL 2 MG/2ML IJ SOLN
INTRAMUSCULAR | Status: AC
  Filled 2012-02-25: qty 2

## 2012-02-25 MED ORDER — SODIUM CHLORIDE 0.9 % IV SOLN
250.0000 mL | INTRAVENOUS | Status: DC | PRN
  Administered 2012-02-26: 250 mL via INTRAVENOUS

## 2012-02-25 MED ORDER — HYDROMORPHONE HCL PF 1 MG/ML IJ SOLN
INTRAMUSCULAR | Status: AC | PRN
  Administered 2012-02-25: 1 mg

## 2012-02-25 MED ORDER — MIDAZOLAM HCL 2 MG/2ML IJ SOLN: 1.0000 mg | INTRAMUSCULAR | Status: DC | PRN

## 2012-02-25 MED ORDER — CEFAZOLIN SODIUM 1-5 GM-% IV SOLN
INTRAVENOUS | Status: AC
  Administered 2012-02-25: 1 g via INTRAVENOUS
  Filled 2012-02-25: qty 50

## 2012-02-25 MED ORDER — HYDROMORPHONE HCL PF 1 MG/ML IJ SOLN
INTRAMUSCULAR | Status: AC
  Filled 2012-02-25: qty 1

## 2012-02-25 MED ORDER — ONDANSETRON HCL 4 MG/2ML IJ SOLN
4.0000 mg | Freq: Four times a day (QID) | INTRAMUSCULAR | Status: DC | PRN
  Administered 2012-02-26: 4 mg via INTRAVENOUS
  Filled 2012-02-25: qty 2

## 2012-02-25 MED ORDER — TENECTEPLASE 50 MG IV KIT: 0.5000 mg/h | PACK | INTRAVENOUS | Status: DC

## 2012-02-25 MED ORDER — FENTANYL CITRATE 0.05 MG/ML IJ SOLN
INTRAMUSCULAR | Status: AC | PRN
  Administered 2012-02-25: 25 ug via INTRAVENOUS
  Administered 2012-02-25: 50 ug via INTRAVENOUS
  Administered 2012-02-25: 25 ug via INTRAVENOUS
  Administered 2012-02-25: 50 ug via INTRAVENOUS
  Administered 2012-02-25 (×2): 25 ug via INTRAVENOUS

## 2012-02-25 MED ORDER — MIDAZOLAM HCL 5 MG/5ML IJ SOLN
INTRAMUSCULAR | Status: AC | PRN
  Administered 2012-02-25: 0.5 mg via INTRAVENOUS
  Administered 2012-02-25 (×3): 1 mg via INTRAVENOUS
  Administered 2012-02-25: 2 mg via INTRAVENOUS
  Administered 2012-02-25: 0.5 mg via INTRAVENOUS

## 2012-02-25 MED ORDER — HEPARIN (PORCINE) IN NACL 100-0.45 UNIT/ML-% IJ SOLN: 800.0000 [IU]/h | INTRAMUSCULAR | Status: DC

## 2012-02-25 MED ORDER — MORPHINE SULFATE 4 MG/ML IJ SOLN: 5.0000 mg | INTRAMUSCULAR | Status: DC | PRN

## 2012-02-25 NOTE — Progress Notes (Signed)
Pharmacy Heparin  Patient was in IR this afternoon so heparin gtts was off.  Received orders to resume heparin dosing per pharmacy.  Heparin was resumed at 5:53 pm per progress note at previously therapeutic rate of 600 units/hr.  Willl check 6h level after restart.  Conlin Brahm L. Illene Bolus, PharmD, BCPS Clinical Pharmacist Pager: [phone] 02/25/2012 7:49 PM

## 2012-02-25 NOTE — H&P (Signed)
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  In 07/2012 he was found to have may thurnder syndrome and underwent catheter directed thrombolysis as well as venous angioplasty and L common femoral vein stenting.  Apparently this stent was occluded and required thrombectomy with angiojet device and recurrent stenting.  He was treated with thrombolytic therapy for several days and discharged on Xarelto.  He followed up in March 2013 and was doing OK, and again in May 2013.  However recently he developed swelling again in the left leg so a venogram was performed which showed a L femoral thrombus so he was admitted on 7/16 for a venogram and thrombolysis.  The venogram showed occlusion of the left iliac stent so TNKase thrombolysis was started and he was transferred to the ICU for monitoring.  Past Medical History  Diagnosis Date  . No pertinent past medical history   . DVT (deep venous thrombosis)    Past Surgical History  Procedure Date  . Mouth surgery   . No past surgeries    Prior to Admission medications   Medication Sig Start Date End Date Taking? Authorizing Provider  rivaroxaban (XARELTO) 10 MG TABS tablet Take 20 mg by mouth daily. 08/17/11  Yes Zannie Cove, MD   Allergies Allergies  Allergen Reactions  . Warfarin Sodium Other (See Comments)    Causes blood clotting    Family History No family history on file. Social History  reports that he has never smoked. He has never used smokeless tobacco. He reports that he drinks alcohol. He reports that he does not use illicit drugs.  Review Of Systems:   Gen: Denies fever, chills, weight change, fatigue, night sweats HEENT: Denies blurred vision, double vision, hearing loss,  tinnitus, sinus congestion, rhinorrhea, sore throat, neck stiffness, dysphagia PULM: Denies shortness of breath, cough, sputum production, hemoptysis, wheezing CV: Denies chest pain, edema, orthopnea, paroxysmal nocturnal dyspnea, palpitations GI: Denies abdominal pain, nausea, vomiting, diarrhea, hematochezia, melena, constipation, change in bowel habits GU: Denies dysuria, hematuria, polyuria, oliguria, urethral discharge Endocrine: Denies hot or cold intolerance, polyuria, polyphagia or appetite change Derm: Denies rash, dry skin, scaling or peeling skin change Heme: per HPI Neuro: Denies headache, numbness, weakness, slurred speech, loss of memory or consciousness   Brief patient description:  21 y/o male with recurrent L common femoral clot due to May - Thurner syndrome, occluded L femoral stent.  Events Since Admission: 7/16 Femoral Venogram >> L common iliac stent occluded  Current Status: stable  Vital Signs: Temp:  [97.6 F (36.4 C)-98.5 F (36.9 C)] 98.3 F (36.8 C) (07/16 2348) Pulse Rate:  [54-88] 54  (07/17 0000) Resp:  [10-31] 11  (07/17 0000) BP: (99-124)/(46-71) 99/51 mmHg (07/17 0000) SpO2:  [95 %-100 %] 95 % (07/17 0000) Weight:  [58.968 kg (130 lb)] 58.968 kg (130 lb) (07/16 0843)  Physical Examination: Gen: well appearing, no acute distress HEENT: NCAT, PERRL, EOMi, OP clear, neck supple without masses; R IJ venous access and L femoral sheath (TNKase infusion) PULM: CTA B CV: RRR, no mgr, no JVD AB: BS+, soft, nontender, no hsm Ext: warm, no edema in legs, good pulses in feet bilaterally, no clubbing, no cyanosis Derm: no rash or skin breakdown Neuro: A&Ox4,  CN II-XII intact, strength 5/5 in all 4 extremities   Principal Problem:  *Left leg DVT Active Problems:  Coagulopathy   ASSESSMENT AND PLAN  HEMATOLOGIC  Lab 02/24/12 1228 02/24/12 0828  HGB 14.7 16.2  HCT 42.3 46.7  PLT 251 294  INR -- --  APTT -- 38*    A:  May - Thurner syndrome  (compression of left iliac vein by R iliac artery and vertebral body), s/p stenting in 07/2011, now with recurrent L femoral vein clot; Unclear if hypercoagulable state at baseline Good discussion of MT syndrome: Dorthey Sawyer al, NEJM 2007; February 12, 2006; 357:53-59  P:  -TNKase per IR 0.5mg /hr -discuss with Hematology on 7/17 AM -monitor for bleeding -repeat venogram/procedure planned by IR 7/17 AM  RENAL  Lab 02/24/12 0828  NA 141  K 4.1  CL 103  CO2 28  BUN 12  CREATININE 0.87  CALCIUM 10.2  MG --  PHOS --   Intake/Output      07/16 0701 - 07/17 0700   P.O. 480   I.V. (mL/kg) 497.5 (8.4)   IV Piggyback 528.3   Total Intake(mL/kg) 1505.8 (25.5)   Urine (mL/kg/hr) 40 (0)   Total Output 40   Net +1465.8        Foley:  7/16  A:  +/- Oliguria tonight? Improved with foley, UOP adequate P:   -monitor UOP -BMET in AM   BEST PRACTICE / DISPOSITION Level of Care:  ICU Primary Service:  PCCM Consultants:  IR Code Status:  Full Diet:  npo DVT Px:  TNKase GI Px:  n/a Skin Integrity:  normal Social / Family:  Not available on my review  Max Fickle, M.D. Pulmonary and Critical Care Medicine [facility] Pager: [phone]  02/25/2012, 1:01 AM

## 2012-02-25 NOTE — Progress Notes (Signed)
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 Warfarin Sodium Other (See Comments)    Causes blood clotting    Patient Measurements: Height: 6' (182.9 cm) Weight: 123 lb 10.9 oz (56.1 kg) IBW/kg (Calculated) : 77.6  Heparin Dosing Weight: 56kg  Vital Signs: Temp: 98.1 F (36.7 C) (07/17 1147) Temp src: Oral (07/17 1147) BP: 107/54 mmHg (07/17 1200) Pulse Rate: 69  (07/17 1200)  Labs:  Basename 02/25/12 0925 02/25/12 0255 02/24/12 1930 02/24/12 1228 02/24/12 0828  HGB -- 13.2 -- 14.7 --  HCT -- 38.2* -- 42.3 46.7  PLT -- 211 -- 251 294  APTT -- -- -- -- 38*  LABPROT -- -- -- -- --  INR -- -- -- -- --  HEPARINUNFRC 0.11* 0.33 0.90* -- --  CREATININE -- 0.75 -- -- 0.87  CKTOTAL -- -- -- -- --  CKMB -- -- -- -- --  TROPONINI -- -- -- -- --    Estimated Creatinine Clearance: 116.9 ml/min (by C-G formula based on Cr of 0.75).   Assessment: 21 yo M with h/o May-Thurner syndrome and prior LLE iliofemoral thrombus with previous thrombolysis and iliac stenting. Had recurrent thrombus in femoral veins on left, and is s/p IR and thrombolysis and began heparin infusion and TNKase 7/16. Currently no bleeding reported per the nurse. TNKase continues to infuse at 0.5mg /hr. Heparin level now subtherapeutic after decrease after being supratherapeutic.  Goal of Therapy:  Anti-Xa level 0.2-0.5 Monitor platelets by anticoagulation protocol: Yes   Plan:  1.  Increase heparin to 600 units/hr 2.  F/up 6 hour heparin level  Rolland Porter, Pharm.D., BCPS Clinical Pharmacist Pager: [phone] 02/25/2012,12:46 PM

## 2012-02-25 NOTE — ED Notes (Signed)
Heparin restarted 600 units/hour

## 2012-02-25 NOTE — Procedures (Signed)
Interventional Radiology Procedure Note  Procedure:  1. Successful crossing of occluded left iliac stents followed by balloon angioplasty and extension of stents.  Channel now open, but inflow remains slow, likely 2/2 changes of chronic DVT more distally 2. Continuation of venous lysis Complications: No immediate Recommendations: - continue lysis overnight to maintain stent patency.  Q6 hr CBC, fibrinogen, PTT  - consult hematology for recommendations on anticoagulation regimen given recurrent DVT on xarelto - will return to IR tomorrow for lysis check and additional intervention in indicated  Signed,  Sterling Big, MD Vascular & Interventional Radiologist [facility] Radiology

## 2012-02-26 ENCOUNTER — Inpatient Hospital Stay (HOSPITAL_COMMUNITY): Payer: BC Managed Care – PPO

## 2012-02-26 ENCOUNTER — Encounter (HOSPITAL_COMMUNITY): Payer: Self-pay

## 2012-02-26 DIAGNOSIS — I825Y9 Chronic embolism and thrombosis of unspecified deep veins of unspecified proximal lower extremity: Secondary | ICD-10-CM

## 2012-02-26 DIAGNOSIS — D6859 Other primary thrombophilia: Secondary | ICD-10-CM

## 2012-02-26 LAB — CBC
MCH: 29.4 pg (ref 26.0–34.0)
MCHC: 34.9 g/dL (ref 30.0–36.0)
MCV: 84.4 fL (ref 78.0–100.0)
Platelets: 170 10*3/uL (ref 150–400)

## 2012-02-26 LAB — FIBRINOGEN: Fibrinogen: 194 mg/dL — ABNORMAL LOW (ref 204–475)

## 2012-02-26 MED ORDER — SODIUM CHLORIDE 0.9 % IV SOLN
0.1500 mg/kg/h | INTRAVENOUS | Status: AC
  Administered 2012-02-26: 0.15 mg/kg/h via INTRAVENOUS
  Filled 2012-02-26: qty 250

## 2012-02-26 MED ORDER — MIDAZOLAM HCL 5 MG/5ML IJ SOLN
INTRAMUSCULAR | Status: AC | PRN
  Administered 2012-02-26 (×2): 1 mg via INTRAVENOUS

## 2012-02-26 MED ORDER — IODIXANOL 320 MG/ML IV SOLN
100.0000 mL | Freq: Once | INTRAVENOUS | Status: AC | PRN
  Administered 2012-02-26: 40 mL via INTRAVENOUS

## 2012-02-26 MED ORDER — FENTANYL CITRATE 0.05 MG/ML IJ SOLN
INTRAMUSCULAR | Status: AC | PRN
  Administered 2012-02-26: 50 ug via INTRAVENOUS
  Administered 2012-02-26: 25 ug via INTRAVENOUS

## 2012-02-26 MED ORDER — METAXALONE 800 MG PO TABS
800.0000 mg | ORAL_TABLET | Freq: Three times a day (TID) | ORAL | Status: DC | PRN
  Administered 2012-02-26: 800 mg via ORAL
  Filled 2012-02-26: qty 1

## 2012-02-26 NOTE — Procedures (Signed)
Procedure:  Follow up venogram of LLE on thrombolytic therapy Findings:  Stent and iliac veins show stable patency.  No need for angioplasty or further stenting.  Femoral and jugular sheaths removed. Plan:  Restart IV heparin -- therapeutic.  Further anticoag per Dr. Myna Hidalgo.  Bedrest x 2 hours.  SCD's.  Tx out of ICU.

## 2012-02-26 NOTE — Care Management Note (Signed)
  Page 2 of 2   02/27/2012     1:38:42 PM   CARE MANAGEMENT NOTE 02/27/2012  Patient:  Willie Charles, Willie Charles   Account Number:  [id number]  Date Initiated:  02/25/2012  Documentation initiated by:  Avie Arenas  Subjective/Objective Assessment:   clots requiring TNK  College student.  has mother.     Action/Plan:   Anticipated DC Date:  02/27/2012   Anticipated DC Plan:  HOME/SELF CARE      DC Planning Services  CM consult      Choice offered to / List presented to:             Status of service:  In process, will continue to follow Medicare Important Message given?   (If response is "NO", the following Medicare IM given date fields will be blank) Date Medicare IM given:   Date Additional Medicare IM given:    Discharge Disposition:    Per UR Regulation:  Reviewed for med. necessity/level of care/duration of stay  If discussed at Long Length of Stay Meetings, dates discussed:    Comments:  ContactJohnathan, [place] Mother [phone]   02-27-12 Co pay is $20.00. Patient aware. Ronny Flurry RN BSN [phone]  02-27-12 Spoke with patient and his mother at bedside. Both confirm patient has taken Arixtra SQ before and are comfortable with injections.   Patient uses [facility] on [address] at the corner of [address] and [address] [phone].  Precription for Arixtra 7.5 mg / 0.6 cc soln. SQ daily , 60 syringes called in to patient's pharmacy.  Pharmacy does have the medication.   Pharmacy said I can call back in 1 hour and they will be able to tell me the co pay.  Explained above to patient and his mother.  Ronny Flurry RN BSN [phone]     02-26-12 2:40pm Avie Arenas, RNBSN - Talked with patient, now back from radiology.  Off TNK plan to go to floor on angiomax - plan to start arixtra on discharge.  benefits check sent to CMA, call [employer] for assistance, informed they no longer have an assistance program.  Patient states was on this in the past but does not  rememeber cost or any assistance with it.  Uses [facility] on [address]. Will need to check to see if pharmacy has medication in stock when dose and amount determined.  Benefits check info: Arixtra is covered with a copay of $50.00 for a 30 day supply. Fondapariniex(generic) is covered too with a copay of $10.00 for a 30 day supply. No prior auth required.

## 2012-02-26 NOTE — H&P (Signed)
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  Events Since Admission: 7/16  L common iliac stent occluded, TNK started 7/17  Balloon angioplasty, stent extensionTNK continued  Current Status: No overnight events.  Vital Signs: Temp:  [98.1 F (36.7 C)-100 F (37.8 C)] 100 F (37.8 C) (07/18 0817) Pulse Rate:  [58-105] 100  (07/18 0800) Resp:  [10-26] 12  (07/18 0800) BP: (94-130)/(44-73) 117/62 mmHg (07/18 0800) SpO2:  [92 %-100 %] 97 % (07/18 0800) Weight:  [55.5 kg (122 lb 5.7 oz)] 55.5 kg (122 lb 5.7 oz) (07/18 0600)  Physical Examination: Gen:  No distress HEENT: PERRL PULM: CTAB CV: RRR, no murmurs AB: Soft, nontender Ext: Good pulses, noedema Derm: No rash or skin breakdown Neuro: Intact  Principal Problem:  *Left leg DVT Active Problems:  Coagulopathy  ASSESSMENT AND PLAN  HEMATOLOGIC  Lab 02/24/12 1228 02/24/12 0828  HGB 14.7 16.2  HCT 42.3 46.7  PLT 251 294  INR -- --  APTT -- 38*   A:  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 480   I.V. (mL/kg) 497.5 (8.4)   IV Piggyback 528.3   Total Intake(mL/kg) 1505.8 (25.5)   Urine  (mL/kg/hr) 40 (0)   Total Output 40   Net +1465.8      Foley:  7/16  A:  Oliguria resolved. P:   Monitor    BEST PRACTICE / DISPOSITION Level of Care:  ICU Primary Service:  PCCM Consultants:  IR Code Status:  Full Diet:  npo DVT Px:  Not indicated (TNKase / Heparin gtt) GI Px:  Not indicatded Skin Integrity:  Normal Social / Family:  Updated at bedside  Lonia Farber, M.D. Pulmonary and Critical Care Medicine [facility] Pager: [phone]  02/26/2012, 9:00 AM

## 2012-02-26 NOTE — ED Notes (Signed)
TNK and Heparin stopped

## 2012-02-26 NOTE — Progress Notes (Signed)
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 Warfarin Sodium Other (See Comments)    Causes blood clotting    Patient Measurements: Height: 6' (182.9 cm) Weight: 123 lb 10.9 oz (56.1 kg) IBW/kg (Calculated) : 77.6  Heparin Dosing Weight: 56kg  Vital Signs: Temp: 99.8 F (37.7 C) (07/18 0354) Temp src: Oral (07/18 0354) BP: 114/63 mmHg (07/18 0300) Pulse Rate: 74  (07/18 0300)  Labs:  Basename 02/26/12 0300 02/25/12 2100 02/25/12 0925 02/25/12 0255 02/24/12 0828  HGB 12.8* 12.9* -- -- --  HCT 36.7* 37.2* -- 38.2* --  PLT 170 195 -- 211 --  APTT -- -- -- -- 38*  LABPROT -- -- -- -- --  INR -- -- -- -- --  HEPARINUNFRC 0.24* -- 0.11* 0.33 --  CREATININE -- -- -- 0.75 0.87  CKTOTAL -- -- -- -- --  CKMB -- -- -- -- --  TROPONINI -- -- -- -- --    Estimated Creatinine Clearance: 116.9 ml/min (by C-G formula based on Cr of 0.75).   Assessment: 21 yo M with recurrent thrombus/thrombolysis for Heparin  Goal of Therapy:  Anti-Xa level 0.2-0.5 Monitor platelets by anticoagulation protocol: Yes   Plan:  Continue Heparin at current rate F/U after IR today  Geannie Risen, PharmD, BCPS  02/26/2012,4:50 AM

## 2012-02-26 NOTE — Progress Notes (Signed)
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 Warfarin Sodium Other (See Comments)    Causes blood clotting    Patient Measurements: Height: 6' (182.9 cm) Weight: 122 lb 5.7 oz (55.5 kg) IBW/kg (Calculated) : 77.6  Heparin Dosing Weight: 56kg  Vital Signs: Temp: 98.3 F (36.8 C) (07/18 1720) BP: 110/63 mmHg (07/18 1720) Pulse Rate: 104  (07/18 1720)  Labs:  Willie Charles 02/26/12 1957 02/26/12 1523 02/26/12 0300 02/25/12 2100 02/25/12 0925 02/25/12 0255 02/24/12 0828  HGB -- -- 12.8* 12.9* -- -- --  HCT -- -- 36.7* 37.2* -- 38.2* --  PLT -- -- 170 195 -- 211 --  APTT 61* 183* -- -- -- -- 38*  LABPROT -- -- -- -- -- -- --  INR -- -- -- -- -- -- --  HEPARINUNFRC -- -- 0.24* -- 0.11* 0.33 --  CREATININE -- -- -- -- -- 0.75 0.87  CKTOTAL -- -- -- -- -- -- --  CKMB -- -- -- -- -- -- --  TROPONINI -- -- -- -- -- -- --    Estimated Creatinine Clearance: 115.6 ml/min (by C-G formula based on Cr of 0.75).   Assessment: 21 yo M with recurrent thrombus/thrombolysis for Heparin. In IR this am stent and iliac veins showed stable patency.  Per discussion with Dr Myna Hidalgo, plan is to d/c heparin, TNKase and start Bivalirudin IV (at least x 24h) with eventual plan to d/c home on Arixtra.  1st aPTT = 183 (drawn only 1.5 hrs after bivalrudin initiated and through peripheral line; just switched from heparin).   Repeat aPTT now is 61, therapeutic for goal.   Goal of Therapy:  aPTT 50-85 sec Monitor platelets by anticoagulation protocol: Yes   Plan:  -Continue bivalirudin infusion at 0.15 mg/kg/hr = 16.7 ml/hr -Repeat in 4 hours, if at goal then q24h -Daily CBC  [name], PharmD, BCPS Clinical Pharmacist [phone] 02/26/2012 8:30 PM

## 2012-02-26 NOTE — Consult Note (Signed)
#   [id number] is note.  Willie E.

## 2012-02-26 NOTE — ED Notes (Signed)
Heparin restarted at 600 u/hr

## 2012-02-26 NOTE — Progress Notes (Signed)
At 1826 pm Pharmacist placed a stat order for aPTT to be drawn. According to report lab was called to draw labs. Patient's mother had a question about labs being drawn. Dr. Frederico Hamman was called given order that labs can be drawn with a butterfly needle as needed. This was  report to lab technician. Patient's mother was also informed, she verbalized understanding. Will continue to monitor patient.

## 2012-02-26 NOTE — ED Notes (Signed)
O2 2L/[place] started 

## 2012-02-26 NOTE — Progress Notes (Addendum)
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 Warfarin Sodium Other (See Comments)    Causes blood clotting    Patient Measurements: Height: 6' (182.9 cm) Weight: 122 lb 5.7 oz (55.5 kg) IBW/kg (Calculated) : 77.6  Heparin Dosing Weight: 56kg  Vital Signs: Temp: 100 F (37.8 C) (07/18 0817) Temp src: Oral (07/18 0817) BP: 115/65 mmHg (07/18 1200) Pulse Rate: 97  (07/18 1200)  Labs:  Basename 02/26/12 0300 02/25/12 2100 02/25/12 0925 02/25/12 0255 02/24/12 0828  HGB 12.8* 12.9* -- -- --  HCT 36.7* 37.2* -- 38.2* --  PLT 170 195 -- 211 --  APTT -- -- -- -- 38*  LABPROT -- -- -- -- --  INR -- -- -- -- --  HEPARINUNFRC 0.24* -- 0.11* 0.33 --  CREATININE -- -- -- 0.75 0.87  CKTOTAL -- -- -- -- --  CKMB -- -- -- -- --  TROPONINI -- -- -- -- --    Estimated Creatinine Clearance: 115.6 ml/min (by C-G formula based on Cr of 0.75).   Assessment: 21 yo M with recurrent thrombus/thrombolysis for Heparin. In IR this am stent and iliac veins showed stable patency.  Per discussion with Dr Myna Hidalgo, plan is to d/c heparin, TNKase and start Bivalirudin IV (at least x 24h) with eventual plan to d/c home on Arixtra.  With heparin at goal this am and off for a while during IR, suspect ok to simply turn off heparin and start bivalirudin simultaneously.  Goal of Therapy:  aPTT 50-85 sec Monitor platelets by anticoagulation protocol: Yes   Plan:  -D/c heparin -Start bivalirudin infusion at 0.15 mg/kg/hr = 16.7 ml/hr -Check 2h aPTT, then q4h until at goal x2 then q24h -Daily CBC  Victoriana Aziz L. Illene Bolus, PharmD, BCPS Clinical Pharmacist Pager: [phone] 02/26/2012 1:08 PM

## 2012-02-26 NOTE — ED Notes (Signed)
O2 d/c'd 

## 2012-02-27 ENCOUNTER — Other Ambulatory Visit: Payer: Self-pay | Admitting: Hematology & Oncology

## 2012-02-27 DIAGNOSIS — D6859 Other primary thrombophilia: Secondary | ICD-10-CM

## 2012-02-27 DIAGNOSIS — I82402 Acute embolism and thrombosis of unspecified deep veins of left lower extremity: Secondary | ICD-10-CM

## 2012-02-27 LAB — FIBRINOGEN: Fibrinogen: 333 mg/dL (ref 204–475)

## 2012-02-27 LAB — CBC
HCT: 36.2 % — ABNORMAL LOW (ref 39.0–52.0)
MCH: 28.8 pg (ref 26.0–34.0)
MCV: 84 fL (ref 78.0–100.0)
RBC: 4.31 MIL/uL (ref 4.22–5.81)
RDW: 12.4 % (ref 11.5–15.5)
WBC: 6 10*3/uL (ref 4.0–10.5)

## 2012-02-27 LAB — APTT: aPTT: 64 seconds — ABNORMAL HIGH (ref 24–37)

## 2012-02-27 MED ORDER — FONDAPARINUX SODIUM 7.5 MG/0.6ML ~~LOC~~ SOLN: 7.5000 mg | SUBCUTANEOUS | Status: DC

## 2012-02-27 MED ORDER — FONDAPARINUX SODIUM 7.5 MG/0.6ML ~~LOC~~ SOLN
7.5000 mg | Freq: Once | SUBCUTANEOUS | Status: AC
  Administered 2012-02-27: 7.5 mg via SUBCUTANEOUS
  Filled 2012-02-27: qty 0.6

## 2012-02-27 MED ORDER — FONDAPARINUX SODIUM 7.5 MG/0.6ML ~~LOC~~ SOLN: 7.5000 mg | Freq: Once | SUBCUTANEOUS | Status: DC

## 2012-02-27 MED ORDER — METAXALONE 800 MG PO TABS: 800.0000 mg | ORAL_TABLET | Freq: Three times a day (TID) | ORAL | Status: AC | PRN

## 2012-02-27 NOTE — Progress Notes (Addendum)
ANTICOAGULATION CONSULT NOTE - Follow Up Consult  Pharmacy Consult for Angiomax Indication: occluded L iliac stent, L femoral thrombus  Labs:  Basename 02/27/12 0315 02/26/12 2315 02/26/12 1957 02/26/12 0300 02/25/12 2100 02/25/12 0925 02/25/12 0255  HGB 12.4* -- -- 12.8* -- -- --  HCT 36.2* -- -- 36.7* 37.2* -- --  PLT 161 -- -- 170 195 -- --  APTT 59* 64* 61* -- -- -- --  LABPROT -- -- -- -- -- -- --  INR -- -- -- -- -- -- --  HEPARINUNFRC -- -- -- 0.24* -- 0.11* 0.33  CREATININE -- -- -- -- -- -- 0.75  CKTOTAL -- -- -- -- -- -- --  CKMB -- -- -- -- -- -- --  TROPONINI -- -- -- -- -- -- --    Assessment/Plan: 20yo male remains therapeutic on Angiomax.  Will continue at current rate.  Plan to dc later today and inititate arixtra.Given t1/2 of bivalirudin and arixtra, should give arixtra and dc bivalirudin at the same time or dc bivalirudin 1 hr after arixtra.  Woodfin Ganja PharmD 02/27/2012,10:28 AM

## 2012-02-27 NOTE — Progress Notes (Signed)
Name: Willie Charles MRN: [id number] DOB: 1990/12/08    LOS: 3  Referring Provider:  Deanne Coffer Reason for Referral:  Thrombolysis  PULMONARY / CRITICAL CARE MEDICINE  HPI:   21 yo male admitted on 02/24/2012 with May-Thurner syndrome and prior (L)LE iliofemoral thrombus with previous thrombolysis and (L)iliac stenting on xarelto.  Developed recurrent leg swelling>>found to have occlusion of Lt iliac stent.  Admitted by IR for thrombolysis to ICU under PCCM care.   Allergies Allergies  Allergen Reactions  . Warfarin Sodium Other (See Comments)    Causes blood clotting    Events Since Admission: 7/16 TNK started for Lt common iliac stent occlusion 7/17 Balloon angioplasty, stent extension  Current Status: Denies chest pain, dyspnea, abdominal pain, or leg pain.  Vital Signs: Temp:  [98.3 F (36.8 C)-99 F (37.2 C)] 98.3 F (36.8 C) (07/19 0554) Pulse Rate:  [73-107] 73  (07/19 0554) Resp:  [13-30] 20  (07/19 0554) BP: (106-129)/(55-78) 120/60 mmHg (07/19 0554) SpO2:  [94 %-100 %] 97 % (07/19 0554)  Physical Examination:  Gen: No distress  HEENT: PERRL  PULM: CTAB  CV: RRR, no murmurs  AB: Soft, nontender  Ext: Good pulses, noedema  Derm: No rash or skin breakdown  Neuro: Intact  Lab Results  Component Value Date   WBC 6.0 02/27/2012   HGB 12.4* 02/27/2012   HCT 36.2* 02/27/2012   MCV 84.0 02/27/2012   PLT 161 02/27/2012   Lab Results  Component Value Date   CREATININE 0.75 02/25/2012   BUN 10 02/25/2012   NA 138 02/25/2012   K 3.9 02/25/2012   CL 103 02/25/2012   CO2 26 02/25/2012     ASSESSMENT AND PLAN  A: May-Thurner syndrome, 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 P: Per IR no additional procedures planned Dr. Myna Hidalgo following for plan with long-term anticoagulation Needs to finish 24 hour infusion of angiomax prior to d/c home (infusion started 7/18 at 2  pm) Needs to continue with arixtra as outpt>>will need to get one dose prior to d/c home Dr. Myna Hidalgo will contact patient to arrange for outpt follow up in 3 to 4 weeks   [name], M.D. Pulmonary and Critical Care Medicine [facility] Pager: [phone]  02/27/2012, 9:41 AM

## 2012-02-27 NOTE — Consult Note (Signed)
NAMEGERASIMOS, PLOTTS NO.:  [id number]  MEDICAL RECORD NO.:  [id number]  LOCATION:  [id number]                        FACILITY:  [facility]  PHYSICIAN:  [name], M.D.  DATE OF BIRTH:  04/27/1991  DATE OF CONSULTATION: DATE OF DISCHARGE:                                CONSULTATION   REFERRING PHYSICIAN:  Veto Kemps, MD  REASON FOR CONSULTATION: 1. Recurrent left iliofemoral DVT. 2. Protein C deficiency.  HISTORY OF PRESENT ILLNESS:  Mr. Willie Charles is well-known to me.  He is a 21- year-old gentleman.  He actually presented back in December 2012 with May-Thurner syndrome.  He underwent thrombolytic therapy.  He underwent stenting.  He seemed to have a good result.  He was subsequently placed on Xarelto.  He is in college in [place].  He has been doing okay at college.  He came back from college.  He then went to [employer] for his brother's graduation.  He began to develop pain in the left leg.  There was some swelling in the left leg.  We actually got him back in for a venogram.  This was done by Interventional Radiology.  The venogram showed that he had thrombus in the left common iliac vein stent.  The interventional radiologist tried their best to angioplasty this.  As such, he was admitted for thrombolytic therapy.  He did have successful thrombolysis.  He did have successful procedure. There was angioplasty that was performed.  The angioplasty and stenting eventually improved flow through the external iliac vein.  Angioplasty of the non-extended portion of the external iliac vein was done up to 12 mm.  He has continuation of thrombolysis.  We were asked to see him so we could help provide outpatient management.  He really did not notice much in the way of swelling of the left leg.  Currently, he feels okay.  There is no cough or shortness of breath. There is no bleeding.  PAST MEDICAL HISTORY:  Remarkable for, 1. May-Thurner syndrome. 2. Mild  protein C deficiency.  ALLERGIES:  COUMADIN.  MEDICATIONS: 1. Xarelto 20 mg p.o. daily. 2. Flexeril 5 mg q.8 h. p.r.n.  SOCIAL HISTORY:  Negative for tobacco or alcohol use.  FAMILY HISTORY:  I think is noncontributory.  PHYSICAL EXAMINATION:  GENERAL:  This is a thin, but well-nourished white gentleman in no obvious distress. VITAL SIGNS:  Temperature of 100, pulse 92, respiratory rate 25, blood pressure 109/65. HEENT:  Head exam shows normocephalic atraumatic skull.  There are no ocular or oral lesions. NECK:  No palpable cervical or supraclavicular lymph nodes. LUNGS:  Clear bilaterally. CARDIAC:  Regular rate and rhythm with normal S1 and S2.  There are no murmurs, rubs, or bruits. ABDOMEN:  Soft with good bowel sounds.  There is no palpable abdominal mass.  There is no palpable hepatosplenomegaly. EXTREMITIES:  No clubbing, cyanosis, or edema.  There is no palpable venous cord in the lower extremities.  He has good range of motion of his joints. SKIN:  No rashes, ecchymoses, or petechia.  LABORATORY STUDIES:  White cell count is 6, hemoglobin 12.8, hematocrit 36.7, platelet count 170.  IMPRESSION:  Mr. 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  This appears to be at where he had his stent placed.  It now appears that this has been opened up.  Hopefully it will continue to stay open.  This is going to be a long-term issue.  I still believe there is one better way or another to try to treat him.  He was on Xarelto.  He seemed to do okay on Xarelto.  I think that we should probably go with subcu breakthrough now.  I will probably keep him on Arixtra for good 6-8 weeks.  I think this would be a good means of providing therapeutic anticoagulation for him.  Once the thrombolytic therapy is done, I will consider putting him on Angiomax infusion.  This is a direct thrombin inhibitor.  We might get some "mileage" out  of direct thrombin inhibitor and that this might help minimize the clot recurrence.  I think that as long as he has these stents in, these will provide the nidus for clot formation.  Maybe with a direct thrombin inhibitor for 24 hours or so, this might "break the cycle" of thrombophilic tendencies.  Mr. Roots clearly needs lifelong anticoagulation.  I am trying to find the right treatment that will allow him to have a good quality of life. I think it is going to be the real key and I think it is going to take a while for [place] to get him on a good protocol.  His mom was with him this morning.  She is incredibly dedicated and incredibly knowledgeable.  It was nice talking to her.  I will certainly help by any way that I can with Mr. Cadman.  I truly appreciate the great care by Critical Care Medicine and Interventional Radiology to try to help Mr. Ganci.  He is so young, I hate to see him develop chronic post phlebitic syndrome picture that will decrease his quality of life.     [name], M.D.     PRE/MEDQ  D:  02/26/2012  T:  02/27/2012  Job:  [id number]

## 2012-02-27 NOTE — Progress Notes (Signed)
ANTICOAGULATION CONSULT NOTE - Follow Up Consult  Pharmacy Consult for Angiomax Indication: occluded L iliac stent, L femoral thrombus  Labs:  Basename 02/26/12 2315 02/26/12 1957 02/26/12 1523 02/26/12 0300 02/25/12 2100 02/25/12 0925 02/25/12 0255 02/24/12 0828  HGB -- -- -- 12.8* 12.9* -- -- --  HCT -- -- -- 36.7* 37.2* -- 38.2* --  PLT -- -- -- 170 195 -- 211 --  APTT 64* 61* 183* -- -- -- -- --  LABPROT -- -- -- -- -- -- -- --  INR -- -- -- -- -- -- -- --  HEPARINUNFRC -- -- -- 0.24* -- 0.11* 0.33 --  CREATININE -- -- -- -- -- -- 0.75 0.87  CKTOTAL -- -- -- -- -- -- -- --  CKMB -- -- -- -- -- -- -- --  TROPONINI -- -- -- -- -- -- -- --    Assessment/Plan: 21yo male remains therapeutic on Angiomax.  Will continue at current rate and confirm stable with am labs.  Colleen Can PharmD BCPS 02/27/2012,12:38 AM

## 2012-02-27 NOTE — Progress Notes (Signed)
Willie Charles is doing okay. He has infusional Angiomax on board right now. His left leg feels good. He will finish his Angiomax today and then be discharged on Arixtra.  I wouldn't keep him on Arixtra for a good 2 months and then we can see about transitioning him to an oral regimen.  There is a little bit of discomfort in the left inguinal region where the catheters were in place. There is no swelling of the left leg. He had a nice response to the thrombolytic procedure and thrombectomy.  He does have protein C deficiency. His protein C level is not that low. However, this is the only abnormality that we have found on his thrombophilic workup.  I told him to wear his compression stocking as much as possible during the daytime. He has heard about a device called Venowave that can be used with compression stockings.  I still think that the fact the he has the stents is the "instigatior" for these recurrent thrombi.  Unfortunately, the stents are needed for venous patency.  There is no chest pain, cough, SOB.  No bleeding is noted.  I will see him in the office in 2 weeks.  I greatly appreciate the GREAT help by IR, CCM and everybody!!!!!  Cindee Lame E.  Molli Hazard 18:11

## 2012-02-27 NOTE — Progress Notes (Signed)
Patient seen late yesterday afternoon prior to transfer out of ICU. Patient was doing well. LE edema improved with mild tenderness at catheter insertion sites without evidence of bleed. He is being started on compression stockings with moderate pressure advised for this patient and a brochure on obtaining same at a discount was provided to him. We have set up a follow up appointment with Dr. Archer Asa with Interventional Radiology on 03/16/12 to re-evaluate his symptoms and likely have dopplers performed to ensure patency of stent prior to his return to school. His information on this appointment has been updated in the discharge follow up section of the chart - patient is aware to contact us in the interim once discharged for any acute concerning changes.

## 2012-02-27 NOTE — Discharge Summary (Signed)
Physician Discharge Summary  Patient ID: Willie Charles MRN: [id number] DOB/AGE: 21-03-1991 21 y.o.  Admit date: 02/24/2012 Discharge date: 02/27/2012    Discharge Diagnoses:  Principal Problem:  *Left leg DVT Active Problems:  Coagulopathy    Summary: Willie Charles is a 21 y.o. 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 He developed recurrent left leg swelling and was found to have occlusion of Lt iliac stent. Admitted by IR on 7/17 for thrombolysis to ICU under PCCM care. In 07/2012 he was found to have may thurnder syndrome and underwent catheter directed thrombolysis as well as venous angioplasty and L common femoral vein stenting. Apparently this stent was occluded and required thrombectomy with angiojet device and recurrent stenting. He was treated with thrombolytic therapy for several days and discharged on Xarelto. He followed up in March 2013 and was doing OK, and again in May 2013. However recently he developed swelling again in the left leg so a venogram was performed by interventional radiology which showed a L femoral thrombus so he was admitted on 7/16 for a venogram and thrombolysis. The venogram showed occlusion of the left iliac stent so TNKase thrombolysis was started and he was transferred to the ICU for monitoring.  No noted complications from venogram.  He was evaluated by Dr. Myna Hidalgo (follows as an outpatient), was treated with IV Angiomax and Arixtra initiated prior to discharge.  He will remain on [place] Arixtra for a minimum of 2 months and then will potentially transition to oral regimen under direction of Dr. Myna Hidalgo.       Significant Diagnostic Studies:  7/16 L common iliac stent occluded, TNK started  7/17 Balloon angioplasty, stent extension TNK continued   Discharge Exam: Gen: well appearing,  no acute distress  HEENT: NCAT, PERRL, EOMi, OP clear, neck supple without masses; R IJ venous access and L femoral sheath (TNKase infusion)  PULM: CTA B  CV: RRR, no mgr, no JVD  AB: BS+, soft, nontender, no hsm  Ext: warm, no edema in legs, good pulses in feet bilaterally, no clubbing, no cyanosis  Derm: no rash or skin breakdown  Neuro: A&Ox4, CN II-XII intact, strength 5/5 in all 4 extremities   Filed Vitals:   02/26/12 1600 02/26/12 1720 02/26/12 2057 02/27/12 0554  BP: 113/68 110/63 111/60 120/60  Pulse: 87 104 79 73  Temp:  98.3 F (36.8 C) 99 F (37.2 C) 98.3 F (36.8 C)  TempSrc:      Resp: 25 20 20 20   Height:      Weight:      SpO2: 96% 97% 99% 97%     Discharge Labs  BMET  Lab 02/25/12 0255 02/24/12 0828  NA 138 141  K 3.9 4.1  CL 103 103  CO2 26 28  GLUCOSE 97 106*  BUN 10 12  CREATININE 0.75 0.87  CALCIUM 9.1 10.2  MG -- --  PHOS -- --     CBC   Lab 02/27/12 0315 02/26/12 0300 02/25/12 2100  HGB 12.4* 12.8* 12.9*  HCT 36.2* 36.7* 37.2*  WBC 6.0 6.0 7.3  PLT 161 170 195     Discharge Orders    Future Appointments: Provider: Department: Dept Phone: Center:   03/11/2012 1:30 PM [name] [place] [phone] None   03/11/2012 2:00 PM [name], MD [facility] [phone]  None   07/26/2012 10:00 AM [facility]  None   07/28/2012 10:00 AM [name] [place] [phone] None   07/28/2012 10:30 AM [name], MD [facility] [phone] None       Follow-up Information    Follow up with [facility], Kandis Cocking, MD on 03/16/2012. (arrive at 1245 for 1 pm appt. Call if needed for directions or if symptoms worsen in interim)    Contact information:   [address]- [facility] [phone])        Follow up with [name], MD on 03/11/2012. (Appt at 1:30 pm)    Contact information:   [address] [place] [address] [phone]           Medication List  As of 02/27/2012  3:20 PM   START taking these medications         fondaparinux 7.5 MG/0.6ML Soln   Commonly known as: ARIXTRA   Inject 0.6 mLs (7.5 mg total) into the skin daily.      metaxalone 800 MG tablet   Commonly known as: SKELAXIN   Take 1 tablet (800 mg total) by mouth 3 (three) times daily as needed.         STOP taking these medications         rivaroxaban 10 MG Tabs tablet          Where to get your medications    These are the prescriptions that you need to pick up.   You may get these medications from any pharmacy.         fondaparinux 7.5 MG/0.6ML Soln   metaxalone 800 MG tablet              Disposition: 01-Home or Self Care  Discharged Condition: Rea Reser has met maximum benefit of inpatient care and is medically stable and cleared for discharge.  Patient is pending follow up as above.      Time spent on disposition:  Greater than 35 minutes.    Patient called to verify his understanding of discharge instructions.  Understands not to take xarelto and to continue sq arixtra.   Signed: Canary Brim, NP-C [facility] Pgr: [phone]  Coralyn Helling, MD [facility] Pulmonary/Critical Care 02/27/2012, 4:49 PM Pager:  [phone] After 3pm call: [phone]

## 2012-03-01 ENCOUNTER — Other Ambulatory Visit: Payer: Self-pay | Admitting: Interventional Radiology

## 2012-03-01 ENCOUNTER — Telehealth (HOSPITAL_COMMUNITY): Payer: Self-pay | Admitting: *Deleted

## 2012-03-01 DIAGNOSIS — I82429 Acute embolism and thrombosis of unspecified iliac vein: Secondary | ICD-10-CM

## 2012-03-03 ENCOUNTER — Other Ambulatory Visit: Payer: Self-pay | Admitting: Interventional Radiology

## 2012-03-03 ENCOUNTER — Ambulatory Visit
Admission: RE | Admit: 2012-03-03 | Discharge: 2012-03-03 | Disposition: A | Discharge status: Home / Self Care | Payer: BC Managed Care – PPO | Source: Ambulatory Visit | Attending: Interventional Radiology | Admitting: Interventional Radiology

## 2012-03-03 ENCOUNTER — Ambulatory Visit: Payer: BC Managed Care – PPO | Admitting: Hematology & Oncology

## 2012-03-03 ENCOUNTER — Other Ambulatory Visit: Payer: BC Managed Care – PPO | Admitting: Lab

## 2012-03-03 DIAGNOSIS — I82429 Acute embolism and thrombosis of unspecified iliac vein: Secondary | ICD-10-CM

## 2012-03-03 DIAGNOSIS — M79606 Pain in leg, unspecified: Secondary | ICD-10-CM

## 2012-03-03 DIAGNOSIS — M7989 Other specified soft tissue disorders: Secondary | ICD-10-CM

## 2012-03-03 NOTE — Progress Notes (Signed)
ESTABLISHED PATIENT OFFICE VISIT - LEVEL III ([id number])  Chief Complaint:  Left lower extremity pain, swelling and erythema  History:  Willie Charles is a 21 year old young man with a history of May-Thurner syndrome, protein C deficiency, and multiple episodes of left lower extremity DVT requiring venous lysis, and stenting of the left iliac veins.  He was recently admitted from 02/24/2012 through 02/27/2012 and treated for thrombosis of his left iliac veins stents.  Following his discharge last Friday, 02/27/2012, he has experienced intermittent pain and soreness of his lower back, and left lower extremity.  Last night, he had an episode where the leg became swollen, tender and re.  Understandably, he was concerned that he was having recurrent DVT symptoms.  He called the IR clinic today and we instructed him to come in for a left lower extremity Doppler ultrasound and clinical evaluation.  Upon arrival to clinic, his symptoms had largely resolved.  His pain is currently under control, and is left leg is no longer swollen or red.  His left lower extremity Doppler ultrasound showed a good result.  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  He does note that he continues to have soreness at the puncture site, which is slowly getting better.  His back pain is intermittent and responds well to muscle relaxers.  Review of Systems:  Pertinent findings as above in the HPI.  Additional 14 point review of systems was negative.  Physical Exam:  Vital signs:  Temperature 38.2 degrees Fahrenheit PR: 108 BP: 102/62 RR: 16 Oxygen saturation: 99% General:  Thin young man in no acute distress.  He appears as stated age. Respiratory:  Lungs are clear to auscultation bilaterally.  He is breathing easily without effort. Cardiovascular:  Sinus tachycardia. Abdominal  exam:  Soft, scaphoid, nondistended, nontender Focused left lower extremity exam:  The left groin puncture site is clean and healing.  There is ecchymosis surrounding the puncture site which is minimally tender to palpation.  However, there is no fluctuance, or redness, or erythema.  No findings to suggest infection.  The left lower extremity itself is unremarkable in appearance.  There is no evidence of swelling, erythema, or tender cords in the thigh or calf.  Assessment and Plan:   Mr. Mofield symptoms are currently largely resolved.  Overall, the pain at the left puncture site is slowly getting better.  I understand and agree with his vigilance regarding possible symptoms of recurrent DVT given the clinical history.  At this time, I see no evidence of acute on chronic DVT of the left lower extremity.  I advised him to continue to watch for recurrent symptoms.  We will keep his appointment with me on August 6 at which time We will assess if his symptoms continue to improve.  Given his negative DVT study today, I see no reason to repeat the DVT study on August 6 unless he presents with worsening of his clinical symptoms. For now, he will continue his Arixtra anticoagulation and compression hose therapy.  Signed,  Sterling Big, MD Vascular & Interventional Radiologist [facility] Radiology

## 2012-03-11 ENCOUNTER — Other Ambulatory Visit (HOSPITAL_BASED_OUTPATIENT_CLINIC_OR_DEPARTMENT_OTHER): Payer: BC Managed Care – PPO | Admitting: Lab

## 2012-03-11 ENCOUNTER — Ambulatory Visit (HOSPITAL_BASED_OUTPATIENT_CLINIC_OR_DEPARTMENT_OTHER): Payer: BC Managed Care – PPO | Admitting: Hematology & Oncology

## 2012-03-11 VITALS — BP 114/66 | HR 98 | Temp 97.2°F | Wt 117.0 lb

## 2012-03-11 DIAGNOSIS — D6859 Other primary thrombophilia: Secondary | ICD-10-CM

## 2012-03-11 DIAGNOSIS — I82402 Acute embolism and thrombosis of unspecified deep veins of left lower extremity: Secondary | ICD-10-CM

## 2012-03-11 DIAGNOSIS — I824Y9 Acute embolism and thrombosis of unspecified deep veins of unspecified proximal lower extremity: Secondary | ICD-10-CM

## 2012-03-11 DIAGNOSIS — I82519 Chronic embolism and thrombosis of unspecified femoral vein: Secondary | ICD-10-CM

## 2012-03-11 DIAGNOSIS — I82409 Acute embolism and thrombosis of unspecified deep veins of unspecified lower extremity: Secondary | ICD-10-CM

## 2012-03-11 LAB — CBC WITH DIFFERENTIAL (CANCER CENTER ONLY)
BASO#: 0 10*3/uL (ref 0.0–0.2)
Eosinophils Absolute: 0.2 10*3/uL (ref 0.0–0.5)
HCT: 43.8 % (ref 38.7–49.9)
HGB: 15 g/dL (ref 13.0–17.1)
LYMPH#: 1.5 10*3/uL (ref 0.9–3.3)
LYMPH%: 37 % (ref 14.0–48.0)
MCV: 86 fL (ref 82–98)
MONO#: 0.3 10*3/uL (ref 0.1–0.9)
NEUT%: 49.2 % (ref 40.0–80.0)
RBC: 5.07 10*6/uL (ref 4.20–5.70)
RDW: 12.8 % (ref 11.1–15.7)
WBC: 4 10*3/uL (ref 4.0–10.0)

## 2012-03-11 NOTE — Progress Notes (Signed)
This office note has been dictated.

## 2012-03-12 NOTE — Progress Notes (Signed)
DIAGNOSIS: 1. 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 2. Protein C deficiency. 3. May-Thurner syndrome.  CURRENT THERAPY:  Arixtra 7.5 mg subcu daily.  INTERIM HISTORY:  Willie Charles comes in for an office visit.  He recently was hospitalized for another recurrence of the DVT of his left leg.  He was seen by Intervention Radiology.  They treated him with thrombolytic therapy.  He had a balloon angioplasty.  His left common iliac stent was occluded.  He had, I think the stent extended.  He subsequently was discharged on Arixtra.  He had a recent ultrasound done of his left leg.  This did show negative for any acute DVT.  He had chronic __________ of the left femoral vein. He had good blood flow out of the leg.  I suppose that this stent might be an issue down the road.  We need to try to maintain aggressive anticoagulation on him.  We will start him on Arixtra.  I want to keep him on Arixtra probably through his next semester in college.  He goes to [place] for college at [employer].  I  feel that we would probably get the best degree of anticoagulation with Arixtra or other low-molecular weight product.  He does have a compression stocking.  He will certainly wear this.  One adjunct to our therapy might be the use of a device referred to as the [facility].  This helps propel blood out of the leg and has been shown to decrease the risk of thrombotic events.  I think this might help Willie Charles if he is driving long distances or up doing activities all day long.  He has had no problems cough wise.  He has had no fevers.  He has had no bleeding.  No problem with left leg swelling.  PHYSICAL EXAMINATION:  General: This is a thin, white gentleman in no obvious distress.  Vital signs:  Temperature of 97.2, pulse 98, respiratory rate 18, blood pressure 114/66.  Weight is 117.  Head and neck:  Normocephalic, atraumatic skull.  There are no ocular or oral lesions.  There are no  palpable cervical or supraclavicular lymph nodes. Lungs:  Clear bilaterally.  Cardiac:  Regular rate and rhythm with normal S1, S2.  There are no murmurs, rubs or bruits.  Abdomen:  Soft with good bowel sounds.  There is no palpable abdominal mass.  There is no fluid wave.  There is no palpable hepatosplenomegaly.  Back:  No tenderness of the spine, ribs, or hips.  Extremities:  Minimal nonpitting edema of the left leg.  He has no plethora of the left leg. There is no venous cord noted in the left leg.  He has good range motion of his joints.  He has good pulses in his distal extremities.  Right leg is unremarkable.  Neurological:  Shows no focal neurological deficits. Skin:  No rashes, ecchymosis or petechia.  LABORATORY STUDIES:  White blood cell count 4, hemoglobin 15, hematocrit 44, platelet count 424.  IMPRESSION:  Willie Charles is a 21 year old gentleman with protein C deficiency.  Again, his protein C level was not markedly depressed, but yet this is the only abnormality that we have found with his thrombophilic workup.  We will maintain him on the Arixtra throughout his next semester.  He thinks he will be back for either Thanksgiving or Christmas.  When he gets back, we will see about repeat Dopplers to assess for his left leg.  Again, we will have to  see about this Ven[place]ave product.  I think the FDA has approved this.  It might be a good complement for his anticoagulation program.  I will have to see if our insurance people can arrange this, so he could get one.  I am just thankful that radiology was so aggressive in trying to relieve his second thromboembolic recurrence.  Willie Charles will let us know when he is going back to town, so we can arrange for Dopplers to be done and followup with [place].    ______________________________ [name], M.D. PRE/MEDQ  D:  03/11/2012  T:  03/12/2012  Job:  [id number]

## 2012-03-16 ENCOUNTER — Ambulatory Visit
Admission: RE | Admit: 2012-03-16 | Discharge: 2012-03-16 | Disposition: A | Discharge status: Home / Self Care | Payer: BC Managed Care – PPO | Source: Ambulatory Visit | Attending: Interventional Radiology | Admitting: Interventional Radiology

## 2012-03-16 VITALS — BP 110/68 | HR 82 | Temp 98.1°F | Resp 14 | Ht 72.0 in | Wt 118.0 lb

## 2012-03-16 DIAGNOSIS — I82429 Acute embolism and thrombosis of unspecified iliac vein: Secondary | ICD-10-CM

## 2012-03-16 NOTE — Progress Notes (Signed)
Pt states that he has been wearing compression hose as needed for activities.  Denies pain.  To return to [employer] for Fall 2013 classes.    Will schedule 6 mo f/u December 2013 during Winter break from classes.

## 2012-03-16 NOTE — Progress Notes (Signed)
Patient ID: Willie Charles, male   DOB: Nov 29, 1990, 21 y.o.   MRN: [id number]  INTERVENTIONAL RADIOLOGY ESTABLISHED PATIENT OFFICE VISIT  Chief Complaint:  Mild residual left leg pain, follow-up of acute on chronic left lower extremity DVT and recanalization of occluded iliac vein stents.   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  His most recent acute on chronic thrombotic episode occurred in July 2013.  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  This was successfully treated with overnight lysis, followed by aggressive recanalization of his occluded iliac veins stents, angioplasty and stent extension into the proximal external iliac vein on 02/25/12.  He was on Xarelto at the time of his recurrent thrombotic episode.  This was changed to Arixtra at the time of discharge. Willie Charles presents today for a scheduled post-procedure follow up evaluation.  I last saw him shortly after discharge on 03/03/2012.  He was having some residual post procedure discomfort and a single transient, self limited episode of swelling and redness at that time which was improving.  An ultrasound performed at that time demonstrated no evidence of acute DVT.  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   He returns today in good spirits. He has had very little pain since his last visit, and no significant leg swelling.  He has been doing an excellent job of wearing his thigh high compression stockings when he anticipates long bouts of standing, or walking.  Yesterday, he ran more errands than he intended and did not wear his compression stocking and experienced some aching last night, and this morning for the first time in the last 2 weeks.  He is tolerating his Arixtra well.   His postprocedure discomfort has resolved.  He anticipates going back to [employer] for the fall semester.   Review of Systems:  Pertinent positives in the HPI above.  Additional 14 point review of systems was negative.  Exam:   Vital signs   Heart rate - 82 Blood pressure:  110/68 Respiratory rate:  14 Temperature:  98.1 degrees Fahrenheit Oxygen saturation:  99% on room air   General:  Alert 21 year old young man in no acute distress. Cardiac:  Regular rate and rhythm.  No pneumothorax or gallops. Pulmonary:  Clear to auscultation bilaterally. Lower extremities:  Legs are symmetric size bilaterally.  No evidence of swelling.  Left groin ecchymosis has resolved.  Not tender to palpation.  Full range of motion.  No palpable, tender cords in the left thigh or calf.  Assessment and Plan:    21 year old male with an excellent clinical response to recent recanalization of occluded left iliac venous stents with extension of the stent system into the proximal external iliac vein, and angioplasty of the mid and distal external iliac vein.  He continues to tolerate his Arixtra well, and is doing a good job of wearing his thigh-high compression stocking.  I re-emphasized the importance of the compression stocking with Willie Charles.  He understands that wearing this in addition to anticoagulation and preventing further thrombotic events, wearing the compression stocking as much as possible during his waking hours offers him the best chance of avoiding the complications of post thrombotic syndrome. Additionally, I understand Dr. Myna Hidalgo is considering using the [facility] device.  I  agree this may be beneficial during periods of long immobilization such as travel. I would like to see Willie Charles again in approximately 6 months during the Christmas break.  We will obtain a repeat left lower extremity ultrasound at that time.    Signed,  Sterling Big, MD Vascular & Interventional Radiologist [facility]  Radiology

## 2012-03-19 ENCOUNTER — Telehealth: Payer: Self-pay | Admitting: Emergency Medicine

## 2012-03-19 NOTE — Telephone Encounter (Signed)
PT CALLED AND STILL CONCERNED THAT HIS LEG IS STILL HURTING AND HAS NOT RESOLVED. HE LEAVES FOR SCHOOL IN ABOUT A WEEK. NOT TAKING ANY MEDS.  WHEN HE HAS BEEN UP FOR A WHILE HIS FOOT TENDS TO TURN RED.  HE WANTS TO KNOW WHY HE IS STILL HAVING THIS PAIN....???  PLEASE CALL PT BACK AT [phone].  PAGED DR [facility] AT 959AM.  10:18AM- SENT MSG TO PA-C'S

## 2012-03-22 ENCOUNTER — Other Ambulatory Visit: Payer: Self-pay | Admitting: Interventional Radiology

## 2012-03-22 DIAGNOSIS — I82409 Acute embolism and thrombosis of unspecified deep veins of unspecified lower extremity: Secondary | ICD-10-CM

## 2012-03-23 ENCOUNTER — Telehealth: Payer: Self-pay | Admitting: Emergency Medicine

## 2012-03-23 ENCOUNTER — Telehealth: Payer: Self-pay | Admitting: Interventional Radiology

## 2012-03-23 ENCOUNTER — Ambulatory Visit
Admission: RE | Admit: 2012-03-23 | Discharge: 2012-03-23 | Disposition: A | Discharge status: Home / Self Care | Payer: BC Managed Care – PPO | Source: Ambulatory Visit | Attending: Interventional Radiology | Admitting: Interventional Radiology

## 2012-03-23 DIAGNOSIS — I82409 Acute embolism and thrombosis of unspecified deep veins of unspecified lower extremity: Secondary | ICD-10-CM

## 2012-03-23 NOTE — Progress Notes (Signed)
I received a call from [place] on Friday 03/19/12.  At our last visit he had a one day history of slightly increased left leg pain which he attributed to not wearing his compression stocking the day before.  He thought he was only going out for a quick errand and ended up running multiple errands and was on his feet without compression support for some time.    He called Friday to let me know that the aching feeling in his left leg was persisting albeit intermittently.  The pain seems to come and go and seems to occur both during activity and rest.  He has not noticed definite relief from his compression hose for this aching pain.  He denies new swelling, redness, or other symtpoms in the leg.  He remarks that this feeling is relatively new and does not feel quite like when he has a clot. He has been compliant with his Arixtra and denies missing any doses.   I am not certain what the etiology of this new pain is, exactly.  I think it unlikely that he has re-thrombosed and I suspect that he may be having some venous insufficiency pain or residual inflammation from his recent day of activity without his compression stocking.  I advised him to wear his compression stocking as much as possible over the weekend, take OTC tylenol Q8 hrs through the weekend and call me on Monday with an update.  If his symptois persist, I will order a Doppler US next week to ensure that he has not developed new clot before he returns to college.  Signed,  Sterling Big, MD Vascular & Interventional Radiologist [facility] Radiology

## 2012-03-23 NOTE — Telephone Encounter (Signed)
PT WAS IN OFFICE FOR LLE DOPPLER U/S PRIOR TO GOING BACK TO SCHOOL.  DR [facility] WANTS PT. TO CONTINUE COMPRESSION STOCKING, ELEVATION WHILE SITTING, [place] TODAY WAS UNCHANGED FROM LAST [place] HERE IN OFFICE.  PT TO CONTACT us DURING THANKSGIVING OR CHRISTMAS BREAK TO BE SEEN.

## 2012-06-23 ENCOUNTER — Other Ambulatory Visit: Payer: Self-pay | Admitting: Interventional Radiology

## 2012-06-23 DIAGNOSIS — I82409 Acute embolism and thrombosis of unspecified deep veins of unspecified lower extremity: Secondary | ICD-10-CM

## 2012-07-07 ENCOUNTER — Other Ambulatory Visit: Payer: Self-pay | Admitting: Interventional Radiology

## 2012-07-07 DIAGNOSIS — I82409 Acute embolism and thrombosis of unspecified deep veins of unspecified lower extremity: Secondary | ICD-10-CM

## 2012-07-13 ENCOUNTER — Telehealth: Payer: Self-pay | Admitting: Emergency Medicine

## 2012-07-13 NOTE — Telephone Encounter (Signed)
LM FOR PT. MOM (COLLEEN) TO MAKE HERE AWARE THAT THE U/S APPT ON 07-26-12 HAS BEEN CANCELLED SINCE WE ARE DOING -THE VENOGRAM ON TUES. 07-27-12- AND THAT DR ENNEVER IS AWARE OF OUR PLAN.

## 2012-07-19 ENCOUNTER — Encounter (HOSPITAL_COMMUNITY): Payer: Self-pay | Admitting: Pharmacist

## 2012-07-23 ENCOUNTER — Other Ambulatory Visit: Payer: Self-pay | Admitting: Radiology

## 2012-07-26 ENCOUNTER — Ambulatory Visit (HOSPITAL_COMMUNITY): Payer: BC Managed Care – PPO

## 2012-07-27 ENCOUNTER — Ambulatory Visit (HOSPITAL_COMMUNITY): Payer: BC Managed Care – PPO

## 2012-07-27 ENCOUNTER — Other Ambulatory Visit: Payer: Self-pay | Admitting: Interventional Radiology

## 2012-07-27 ENCOUNTER — Encounter (HOSPITAL_COMMUNITY): Payer: Self-pay

## 2012-07-27 ENCOUNTER — Ambulatory Visit (HOSPITAL_COMMUNITY)
Admission: RE | Admit: 2012-07-27 | Discharge: 2012-07-27 | Disposition: A | Discharge status: Home / Self Care | Payer: BC Managed Care – PPO | Source: Ambulatory Visit | Attending: Interventional Radiology | Admitting: Interventional Radiology

## 2012-07-27 DIAGNOSIS — I82509 Chronic embolism and thrombosis of unspecified deep veins of unspecified lower extremity: Secondary | ICD-10-CM

## 2012-07-27 DIAGNOSIS — I82409 Acute embolism and thrombosis of unspecified deep veins of unspecified lower extremity: Secondary | ICD-10-CM

## 2012-07-27 DIAGNOSIS — I825Y9 Chronic embolism and thrombosis of unspecified deep veins of unspecified proximal lower extremity: Secondary | ICD-10-CM | POA: Insufficient documentation

## 2012-07-27 DIAGNOSIS — M79609 Pain in unspecified limb: Secondary | ICD-10-CM | POA: Insufficient documentation

## 2012-07-27 DIAGNOSIS — Z538 Procedure and treatment not carried out for other reasons: Secondary | ICD-10-CM | POA: Insufficient documentation

## 2012-07-27 LAB — BASIC METABOLIC PANEL
Calcium: 9.5 mg/dL (ref 8.4–10.5)
Chloride: 104 mEq/L (ref 96–112)
Creatinine, Ser: 0.77 mg/dL (ref 0.50–1.35)
GFR calc Af Amer: >90 mL/min (ref >90)
Sodium: 141 mEq/L (ref 135–145)

## 2012-07-27 LAB — CBC
HCT: 44.1 % (ref 39.0–52.0)
Hemoglobin: 15 g/dL (ref 13.0–17.0)
MCH: 29.5 pg (ref 26.0–34.0)
MCV: 86.8 fL (ref 78.0–100.0)
RBC: 5.08 MIL/uL (ref 4.22–5.81)
WBC: 3.4 10*3/uL — ABNORMAL LOW (ref 4.0–10.5)

## 2012-07-27 LAB — PROTIME-INR: Prothrombin Time: 14 seconds (ref 11.6–15.2)

## 2012-07-27 LAB — APTT: aPTT: 29 seconds (ref 24–37)

## 2012-07-27 MED ORDER — ONDANSETRON HCL 4 MG/2ML IJ SOLN
INTRAMUSCULAR | Status: DC | PRN
  Administered 2012-07-27 (×2): 4 mg via INTRAVENOUS

## 2012-07-27 MED ORDER — FENTANYL CITRATE 0.05 MG/ML IJ SOLN
INTRAMUSCULAR | Status: DC | PRN
  Administered 2012-07-27 (×4): 25 ug via INTRAVENOUS
  Administered 2012-07-27: 50 ug via INTRAVENOUS
  Administered 2012-07-27: 25 ug via INTRAVENOUS
  Administered 2012-07-27: 50 ug via INTRAVENOUS
  Administered 2012-07-27: 25 ug via INTRAVENOUS

## 2012-07-27 MED ORDER — MIDAZOLAM HCL 2 MG/2ML IJ SOLN
INTRAMUSCULAR | Status: AC
  Filled 2012-07-27: qty 4

## 2012-07-27 MED ORDER — ONDANSETRON HCL 4 MG/2ML IJ SOLN
INTRAMUSCULAR | Status: AC
  Administered 2012-07-27: 4 mg via INTRAVENOUS
  Filled 2012-07-27: qty 2

## 2012-07-27 MED ORDER — FENTANYL CITRATE 0.05 MG/ML IJ SOLN
INTRAMUSCULAR | Status: AC
  Filled 2012-07-27: qty 4

## 2012-07-27 MED ORDER — HEPARIN SOD (PORK) LOCK FLUSH 100 UNIT/ML IV SOLN
INTRAVENOUS | Status: DC | PRN
  Administered 2012-07-27: 1000 [IU] via INTRAVENOUS
  Administered 2012-07-27: 500 [IU] via INTRAVENOUS

## 2012-07-27 MED ORDER — MIDAZOLAM HCL 2 MG/2ML IJ SOLN
INTRAMUSCULAR | Status: DC | PRN
  Administered 2012-07-27: 0.5 mg via INTRAVENOUS
  Administered 2012-07-27 (×2): 1 mg via INTRAVENOUS
  Administered 2012-07-27: 0.5 mg via INTRAVENOUS
  Administered 2012-07-27: 1 mg via INTRAVENOUS

## 2012-07-27 MED ORDER — IOHEXOL 300 MG/ML  SOLN
100.0000 mL | Freq: Once | INTRAMUSCULAR | Status: AC | PRN
  Administered 2012-07-27: 75 mL via INTRAVENOUS

## 2012-07-27 MED ORDER — HYDROCODONE-ACETAMINOPHEN 5-325 MG PO TABS: 1.0000 | ORAL_TABLET | ORAL | Status: AC | PRN

## 2012-07-27 MED ORDER — FONDAPARINUX SODIUM 7.5 MG/0.6ML ~~LOC~~ SOLN
7.5000 mg | Freq: Once | SUBCUTANEOUS | Status: AC
  Administered 2012-07-27: 7.5 mg via SUBCUTANEOUS

## 2012-07-27 MED ORDER — SODIUM CHLORIDE 0.9 % IV SOLN
Freq: Once | INTRAVENOUS | Status: AC
  Administered 2012-07-27: 1000 mL via INTRAVENOUS

## 2012-07-27 MED ORDER — FONDAPARINUX SODIUM 7.5 MG/0.6ML ~~LOC~~ SOLN: 7.5000 mg | Freq: Once | SUBCUTANEOUS | Status: DC

## 2012-07-27 MED ORDER — FONDAPARINUX SODIUM 7.5 MG/0.6ML ~~LOC~~ SOLN
7.5000 mg | Freq: Once | SUBCUTANEOUS | Status: DC
  Filled 2012-07-27: qty 0.6

## 2012-07-27 NOTE — Procedures (Signed)
Interventional Radiology Procedure Note  Procedure: 1.) Diagnostic LLE venogram shows chronic DVT/synechia in the right ATK popliteal, femoral and common femoral veins 2.) Successful balloon PTA to 8 mm Complications: None Recommendations: - Bedrest leg straight x 2 hrs - Arixtra dose at noon  Signed,  Sterling Big, MD Vascular & Interventional Radiologist [facility] Radiology

## 2012-07-27 NOTE — H&P (Signed)
Agree with PA note.  Willie Charles has had no significant swelling but continues to have intermittent thigh pain with increased activity.  Will perform diagnostic venogram from popliteal vein approach with possible PTA/stenting/thrombolysis if indicated.   Signed,  Sterling Big, MD Vascular & Interventional Radiologist [facility] Radiology

## 2012-07-27 NOTE — ED Notes (Signed)
C/O nausea zofran given

## 2012-07-27 NOTE — H&P (Signed)
Willie Charles is an 21 y.o. male.   Chief Complaint: Previous left low extremity DVT Thrombolysis/angioplast/stent 02/2012 Recent occasional leg pain  No swelling noted Scheduled now for left leg venogram with possible thrombolysis and possible angioplasty/stent HPI: May Turner syndrome; Prot C deficiency  Past Medical History  Diagnosis Date  . No pertinent past medical history   . DVT (deep venous thrombosis)     Past Surgical History  Procedure Date  . Mouth surgery   . No past surgeries     No family history on file. Social History:  reports that he has never smoked. He has never used smokeless tobacco. He reports that he drinks alcohol. He reports that he does not use illicit drugs.  Allergies:  Allergies  Allergen Reactions  . Warfarin Sodium Other (See Comments)    Causes blood clotting     (Not in a hospital admission)  Results for orders placed during the hospital encounter of 07/27/12 (from the past 48 hour(s))  CBC     Status: Abnormal   Collection Time   07/27/12  7:34 AM      Component Value Range Comment   WBC 3.4 (*) 4.0 - 10.5 K/uL    RBC 5.08  4.22 - 5.81 MIL/uL    Hemoglobin 15.0  13.0 - 17.0 g/dL    HCT 40.9  81.1 - 91.4 %    MCV 86.8  78.0 - 100.0 fL    MCH 29.5  26.0 - 34.0 pg    MCHC 34.0  30.0 - 36.0 g/dL    RDW 78.2  95.6 - 21.3 %    Platelets 237  150 - 400 K/uL    No results found.  Review of Systems  Constitutional: Negative for fever and chills.  Respiratory: Negative for cough.   Cardiovascular: Negative for chest pain.  Gastrointestinal: Negative for nausea and vomiting.  Musculoskeletal:       Left leg pain off and on; especially with exertion  Neurological: Negative for weakness and headaches.  Psychiatric/Behavioral: The patient is not nervous/anxious.     Blood pressure 116/64, pulse 77, temperature 97.3 F (36.3 C), temperature source Oral, resp. rate 20, height 6' (1.829 m), weight 120 lb (54.432 kg), SpO2  100.00%. Physical Exam  Constitutional: He is oriented to person, place, and time. He appears well-developed and well-nourished.  Cardiovascular: Normal rate, regular rhythm and normal heart sounds.   No murmur heard. Respiratory: Effort normal and breath sounds normal. He has no wheezes.  GI: Soft. Bowel sounds are normal. There is no tenderness.  Musculoskeletal: Normal range of motion.       No swelling; no redness of B low extremities  Neurological: He is alert and oriented to person, place, and time.  Skin: Skin is warm and dry.  Psychiatric: He has a normal mood and affect. His behavior is normal. Judgment and thought content normal.     Assessment/Plan Previous LLE DVT thrombolysis and pta/stent 02/2012 Now with new pain Scheduled for venogram and poss lysis/pta/stent Pt and mom aware of procedure benefits and risks and agreeable to proceed. Consent signed and in chart  Willie Charles A 07/27/2012, 8:05 AM

## 2012-07-28 ENCOUNTER — Other Ambulatory Visit: Payer: BC Managed Care – PPO | Admitting: Lab

## 2012-07-28 ENCOUNTER — Ambulatory Visit: Payer: BC Managed Care – PPO | Admitting: Hematology & Oncology

## 2012-07-28 ENCOUNTER — Telehealth (HOSPITAL_COMMUNITY): Payer: Self-pay | Admitting: *Deleted

## 2012-07-29 ENCOUNTER — Telehealth: Payer: Self-pay | Admitting: Radiology

## 2012-07-29 NOTE — Telephone Encounter (Signed)
Spoke w/ Jill Side (patient's mother).  She states that Willie Charles is doing well.  Ambulation w/o problems.  No complaints.    She is uncertain at present regarding patient's Spring break or at what point he will be in the area to schedule a follow up.  We will contact her in a few weeks to determine when follow up may be scheduled.

## 2012-08-09 ENCOUNTER — Telehealth: Payer: Self-pay | Admitting: Emergency Medicine

## 2012-08-09 ENCOUNTER — Other Ambulatory Visit: Payer: Self-pay | Admitting: Interventional Radiology

## 2012-08-09 ENCOUNTER — Ambulatory Visit
Admission: RE | Admit: 2012-08-09 | Discharge: 2012-08-09 | Disposition: A | Discharge status: Home / Self Care | Payer: BC Managed Care – PPO | Source: Ambulatory Visit | Attending: Interventional Radiology | Admitting: Interventional Radiology

## 2012-08-09 DIAGNOSIS — I82509 Chronic embolism and thrombosis of unspecified deep veins of unspecified lower extremity: Secondary | ICD-10-CM

## 2012-08-09 NOTE — Telephone Encounter (Signed)
PT. CALLED W/ C/C OF LT HIP PAIN AND NOW RADIATES INTO THE LT THIGH.  STARTED ABOUT 4-5 DAYS AGO AND SLOWLY BECOMING WORSE.   NO SIGNIFICANT SWELLING.    PT WILL BE IN OUR OFFICE AT 330PM TO REGISTER FOR A DVT U/S. HE IS SUPPOSE TO GO BACK TO SCHOOL ON 08-17-12.  WILL PAGE DR [facility] AND HOLD PT. WHILE HE LOOKS AT U/S. FROM TODAY.

## 2012-11-08 ENCOUNTER — Telehealth: Payer: Self-pay | Admitting: Emergency Medicine

## 2012-11-08 NOTE — Telephone Encounter (Signed)
Pt called for advice.He had been to [place] and walking a lot and also drove a friend to the airport recently.  Since the trip his leg has had increased pain, not much swelling and little red.  He is still taking his blood thinners.  Been taking Tylenol and not helping much, still wearing his compression hose and elevating his leg when he can.   Told pt. To go see the school nurse or his hematologists to ck his leg to make sure he doesn't have a new clot and to see if they can Rx something for his pain.   Told pt to call us when he is back in town to follow-up with Dr. Archer Asa.  Pt. Agreed with the above.

## 2013-02-10 ENCOUNTER — Telehealth: Payer: Self-pay | Admitting: Emergency Medicine

## 2013-02-10 NOTE — Telephone Encounter (Signed)
CALLED PT TO CK STATUS AND TO SEE IF HE WOULD LIKE TO SET UP A F/U APPT W/ DR HM.   HE IS DOING WELL, HAS NOT HAD ANY SYMPTOMS OR PROBLEMS.- STAYING AT SCHOOL FOR THE SUMMER AND MAY NOT BE HOME UNTIL HOLIDAY TIME. HE WILL CALL us IF NEEDED OR TO SET UP A F/U APPT WHEN HE IS HOME.

## 2013-04-25 ENCOUNTER — Other Ambulatory Visit: Payer: Self-pay | Admitting: Nurse Practitioner

## 2013-04-25 DIAGNOSIS — D689 Coagulation defect, unspecified: Secondary | ICD-10-CM

## 2013-04-25 DIAGNOSIS — I82402 Acute embolism and thrombosis of unspecified deep veins of left lower extremity: Secondary | ICD-10-CM

## 2013-04-25 MED ORDER — FONDAPARINUX SODIUM 7.5 MG/0.6ML ~~LOC~~ SOLN: 7.5000 mg | SUBCUTANEOUS | Status: DC

## 2013-04-25 NOTE — Telephone Encounter (Signed)
Per phone call from mother, pt needs a refill of his Arixtra sent to [facility] on [address]. Verified with Dr. Myna Hidalgo as pt is away at college in [place]. Refill sent to [facility] as requested.

## 2013-07-12 ENCOUNTER — Telehealth: Payer: Self-pay | Admitting: Hematology & Oncology

## 2013-07-12 NOTE — Telephone Encounter (Signed)
Patient's mother called and resch 07/28/12 missed apt for 08/09/13

## 2013-08-01 ENCOUNTER — Telehealth: Payer: Self-pay | Admitting: Emergency Medicine

## 2013-08-01 NOTE — Telephone Encounter (Signed)
Pt called to set up f/u appt w/ Dr Archer Asa- he is only in town for a week.  Nothing ava. Until 08-23-13.  He is doing well w/o any complications or symptoms.  Still wearing compression hose.  He will call us back during spring break or during the summer if he is home.

## 2013-08-09 ENCOUNTER — Ambulatory Visit (HOSPITAL_BASED_OUTPATIENT_CLINIC_OR_DEPARTMENT_OTHER): Payer: BC Managed Care – PPO | Admitting: Hematology & Oncology

## 2013-08-09 ENCOUNTER — Ambulatory Visit (HOSPITAL_BASED_OUTPATIENT_CLINIC_OR_DEPARTMENT_OTHER)
Admission: RE | Admit: 2013-08-09 | Discharge: 2013-08-09 | Disposition: A | Discharge status: Home / Self Care | Payer: BC Managed Care – PPO | Source: Ambulatory Visit | Attending: Hematology & Oncology | Admitting: Hematology & Oncology

## 2013-08-09 ENCOUNTER — Other Ambulatory Visit (HOSPITAL_BASED_OUTPATIENT_CLINIC_OR_DEPARTMENT_OTHER): Payer: BC Managed Care – PPO | Admitting: Lab

## 2013-08-09 ENCOUNTER — Other Ambulatory Visit: Payer: Self-pay | Admitting: Nurse Practitioner

## 2013-08-09 VITALS — BP 106/62 | HR 85 | Temp 98.0°F | Resp 16 | Ht 70.0 in | Wt 117.0 lb

## 2013-08-09 DIAGNOSIS — Z86718 Personal history of other venous thrombosis and embolism: Secondary | ICD-10-CM

## 2013-08-09 DIAGNOSIS — I82402 Acute embolism and thrombosis of unspecified deep veins of left lower extremity: Secondary | ICD-10-CM

## 2013-08-09 DIAGNOSIS — D689 Coagulation defect, unspecified: Secondary | ICD-10-CM

## 2013-08-09 DIAGNOSIS — Z7901 Long term (current) use of anticoagulants: Secondary | ICD-10-CM

## 2013-08-09 DIAGNOSIS — D6859 Other primary thrombophilia: Secondary | ICD-10-CM | POA: Insufficient documentation

## 2013-08-09 DIAGNOSIS — Q969 Turner's syndrome, unspecified: Secondary | ICD-10-CM | POA: Insufficient documentation

## 2013-08-09 DIAGNOSIS — I825Y9 Chronic embolism and thrombosis of unspecified deep veins of unspecified proximal lower extremity: Secondary | ICD-10-CM | POA: Insufficient documentation

## 2013-08-09 LAB — CBC WITH DIFFERENTIAL (CANCER CENTER ONLY)
Eosinophils Absolute: 0.2 10*3/uL (ref 0.0–0.5)
HCT: 46.7 % (ref 38.7–49.9)
LYMPH%: 38.4 % (ref 14.0–48.0)
MCH: 29.4 pg (ref 28.0–33.4)
MCV: 86 fL (ref 82–98)
MONO#: 0.3 10*3/uL (ref 0.1–0.9)
MONO%: 9.4 % (ref 0.0–13.0)
NEUT%: 45 % (ref 40.0–80.0)
Platelets: 251 10*3/uL (ref 145–400)
RDW: 12.7 % (ref 11.1–15.7)

## 2013-08-09 LAB — PROTIME-INR (CHCC SATELLITE)

## 2013-08-10 LAB — D-DIMER, QUANTITATIVE: D-Dimer, Quant: 0.27 ug/mL-FEU (ref 0.00–0.48)

## 2013-08-10 MED ORDER — RIVAROXABAN 20 MG PO TABS: 20.0000 mg | ORAL_TABLET | Freq: Every day | ORAL | Status: DC

## 2013-08-10 NOTE — Progress Notes (Signed)
DIAGNOSES: 1. 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 2. May-Thurner syndrome. 3. Mild protein C deficiency.  CURRENT THERAPY: 1. Arixtra 7.5 mg subcu daily. 2. The patient will be transitioned back to Xarelto 20 mg p.o. daily.  INTERIM HISTORY:  Willie Charles comes in for followup.  We last saw him about a year or so ago.  He goes to college in [place].  He will graduate in May of next year.  He is doing well.  He has had no problems with his left leg.  He has had no swelling of the left leg.  He did have an aggressive intervention by Radiology when he had recurrence back in July 2013.  He had balloon angioplasty.  This was followed by stent.  He had a thrombolytic therapy done into the thrombus.  His last Doppler was back in December 2013.  This did not show any acute thrombus in the left common femoral vein.  He had good flow.  He had some areas of thickening, which were recanalized.  Again, he has had no problems with the Arixtra.  His protein C level is mild at best.  I rechecked in May 2013, his total protein S was 63 with an activity level of 110%.  He has had no pain in the left leg.  He has had no bowel or bladder issues.  He has had no cough or shortness of breath.  He is an incredibly thin guy.  He eats well.  He has had no nausea or vomiting.  PHYSICAL EXAMINATION:  On his physical exam, this is a thin white gentleman in no obvious distress.  Vital signs show temperature of 98, pulse 85, respiratory rate 16, blood pressure 106/62, weight is 117 pounds.  Head and neck exam shows a normocephalic, atraumatic skull. There is no ocular or oral lesions.  There are no palpable cervical or supraclavicular lymph nodes.  Lungs are clear bilaterally.  Cardiac: Regular rate and rhythm with a normal S1, S2.  There are no murmurs, rubs, or bruits.  Abdomen is soft.  He has good bowel sounds.  There is no fluid wave.  There is no palpable abdominal mass.  There is  no palpable hepatosplenomegaly.  Extremities shows no clubbing, cyanosis, or edema.  No post phlebitic issues are noted with the left leg.  He has a negative Homans sign with left leg.  No venous cord is noted in the lower leg or thigh.  Right leg has been unremarkable.  Skin:  No rashes, ecchymosis, or petechia.  LABORATORY STUDIES:  White cell count is 3.6, hemoglobin 15.9, hematocrit 46.7, platelet count 251.  IMPRESSION:  Willie Charles is a very nice 22 year old gentleman.  He initially presented with a significant iliofemoral thrombus of the left leg.  This was back in December 2012.  He has again the May-Thurner syndrome.  He was seen by Interventional Radiology.  They really helped out a quite of bit.  He initially was on Coumadin when he had a recurrence.  We were able to switch him over to Xarelto.  He then had another episode, I think, in July 2013.  He was on Xarelto. He again was noted have a thrombus in the left common iliac vein.  This was done by venogram.  He underwent successful thrombolysis with angioplasty and stenting.  Again, I feel very confident that we can switch him over to Xarelto again.  I think, the problem was more anatomic than a functional hypercoagulable issue.  I  believe that Xarelto would be appropriate for him.  We are giving another Doppler on his legs today.  I would like to see where things are.  We will go ahead and plan to get him back to see [place] probably in another 6 months or so when he gets back from school.    ______________________________ [name], M.D. PRE/MEDQ  D:  08/09/2013  T:  08/10/2013  Job:  [id number]

## 2013-11-30 ENCOUNTER — Telehealth: Payer: Self-pay | Admitting: Hematology & Oncology

## 2013-11-30 NOTE — Telephone Encounter (Signed)
Left pt message to call and schedule appointments °

## 2013-12-16 ENCOUNTER — Telehealth: Payer: Self-pay | Admitting: Hematology & Oncology

## 2013-12-16 NOTE — Telephone Encounter (Signed)
Talked to pt to schedule follow up appointments. He said he would call back to reschedule

## 2014-03-09 IMAGING — US IR [PERSON_NAME]/EXT/BI
1 series · 4 of 4 positions shown · non-contrast
Comparison: 08/12/2011 and earlier studies

CLINICAL DATA: Previous left lower extremity DVT and May Turner's
syndrome, status post lysis and iliac stent placement x2.  Now with
recurrent left lower extremity swelling.  Ultrasound demonstrates
extensive chronic femoropopliteal DVT.

PELVIC VENOGRAPHY
INITIATION OF CATHETER DIRECTED THROMBOLYSIS
ULTRASOUND GUIDANCE FOR VASCULAR ACCESS X2

[Series 1: ir (person_name)/ext/bi · 4 of 4 slices shown]
[im 1/4]
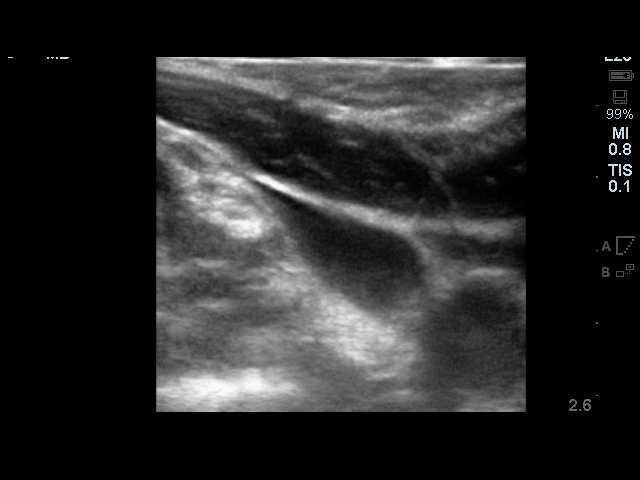
[im 2/4]
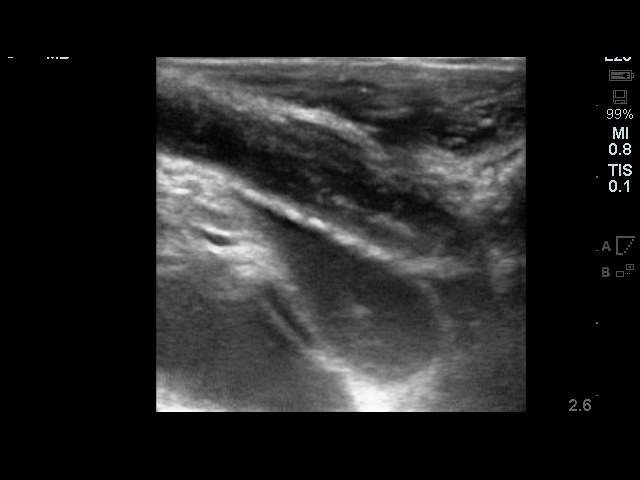
[im 3/4]
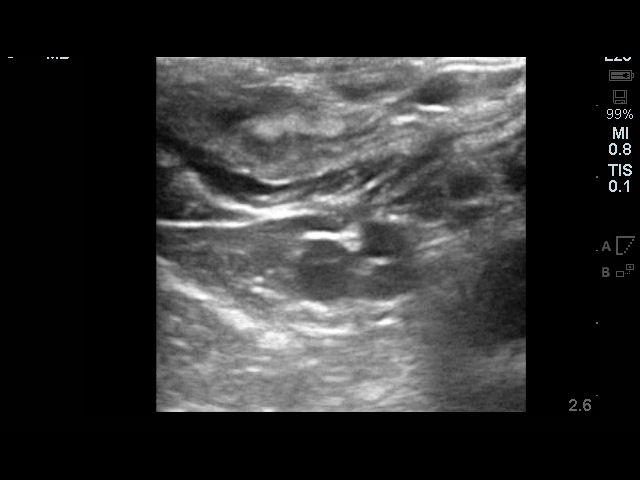
[im 4/4]
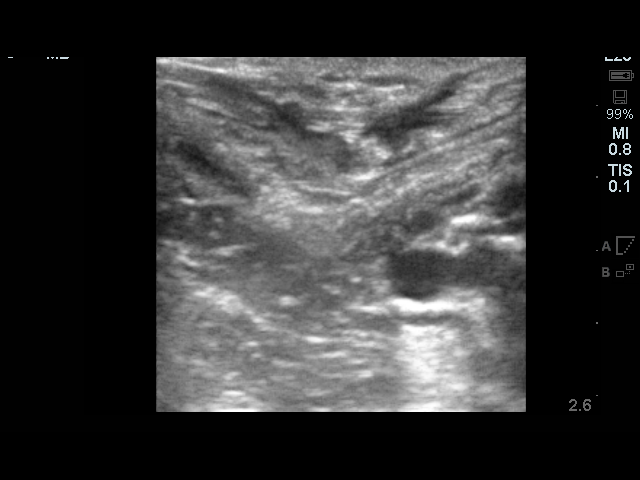

[4 of 4 positions shown; findings below may reference images not displayed]

Technique findings: The procedure, risks (including but not limited
to bleeding, infection, organ damage, intracranial hemorrhage),
benefits, and alternatives were explained to the patient.
Questions regarding the procedure were encouraged and answered.
The patient understands and consents to the procedure.Procedure,
risks, and benefits were also reviewed with the patient's mother.

The right neck region was prepped and draped usual sterile fashion.
Maximal barrier sterile technique was utilized including caps,
mask, sterile gowns, sterile gloves, sterile drape, hand hygiene
and skin antiseptic.

Intravenous Fentanyl and Versed were administered as conscious
sedation during continuous cardiorespiratory monitoring by the
radiology RN, with a total moderate sedation time of 130 minutes.

After the region was infiltrated with 1% lidocaine, the right IJ
vein was accessed with a 21-gauge micropuncture needle, exchanged
over a 018 guide wire for a transitional dilator, which allowed
passage of a Benson wire into the IVC.  Over this, a 4-French
pigtail catheter was advanced.  This was exchanged for a 4-French
angled catheter, used to gain access to the right common femoral
vein for right pelvic venography.  This showed wide patency of the
right iliac venous system through the IVC with no significant
stenosis, thrombus, or collateral filling.

Multiple attempts were made to negotiate the left common iliac
venous stent from the IJ approach.  Despite placement of a  long 5-
French vascular sheath and a variety of catheter and  guidewire
combinations, thrombus in the stent could not be traversed.  For
this reason, the left femoral region was prepped and draped usual
sterile fashion.  The left common femoral vein was accessed with a
21-gauge micropuncture needle under real time ultrasound guidance.
Ultrasound documentation was stored.  The needle was exchanged over
the 018 guide wire for a transitional dilator, through which a
Rosen wire was advanced.  A short 7-French vascular sheath was
placed.  Pelvic venography shows thrombosis of the left common
iliac vein stent with multiple pelvic collaterals providing
drainage of the left lower extremity.  A variety of catheter and
guidewire combinations were advanced in attempts to traverse the
thrombus within the left common iliac vein stent.  A long 7-French
vascular sheath was placed to further support the catheter and
guidewire combinations.  Ultimately however, thrombus in the lumen
of the stent could not be traversed with the guide wire.  For this
reason, after consultation with Dr. Locklear and  discussion with
the patient's mother, catheter directed thrombolytic infusion
through the left iliac vein stent was initiated.

The patient tolerated the procedure well.  No immediate
complication.
IMPRESSION: 1.  Widely patent right iliac venous system.
2.  Chronic thrombosis of the left common iliac vein stent.  Guide
wire traversal from antegrade and retrograde approaches
unsuccessful.
3.  Initiation of catheter directed left iliac venous thrombolysis,
with plans for follow-up venography and treatment of the iliac
venous stent thrombosis on 02/25/2012.

## 2014-03-11 IMAGING — XA IR THROMB F/U EVAL ART/VEN FINAL DAY
1 series · 12 of 19 positions shown · non-contrast
Comparison: 02/25/2012

CLINICAL DATA: History of Donna syndrome and previous
thrombolytic therapy and intravascular stenting to treat left-sided
iliofemoral DVT.  Recurrent symptomatic DVT has now necessitated re-
intervention and the patient is status post initiation of
transcatheter thrombolytic therapy on 02/24/2012.  Further
recanalization of an occluded common iliac venous stent with
placement of a new overlapping stent was performed yesterday.
Additional transcatheter thrombolytic therapy was also performed
overnight.

FOLLOW-UP VENOGRAPHY DURING THROMBOLYTIC THERAPY ON FINAL DAY OF
THERAPY.

[Series 1: run · 12 of 19 slices shown]
[im 1/19]
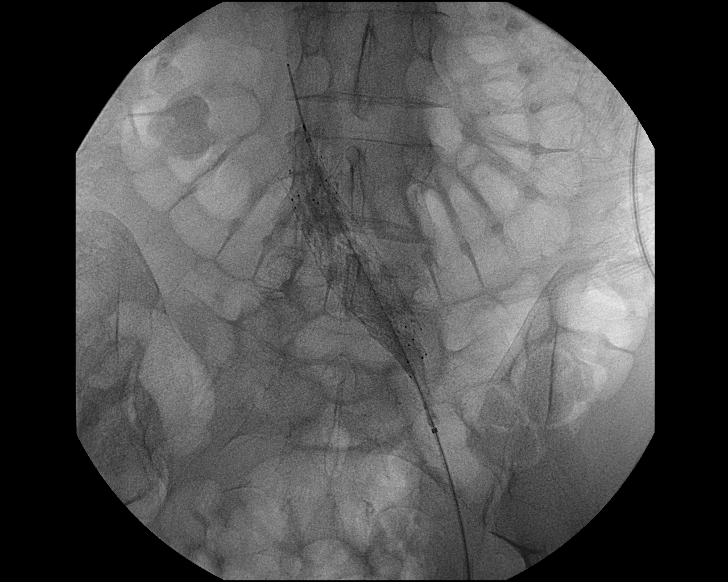
[im 3/19]
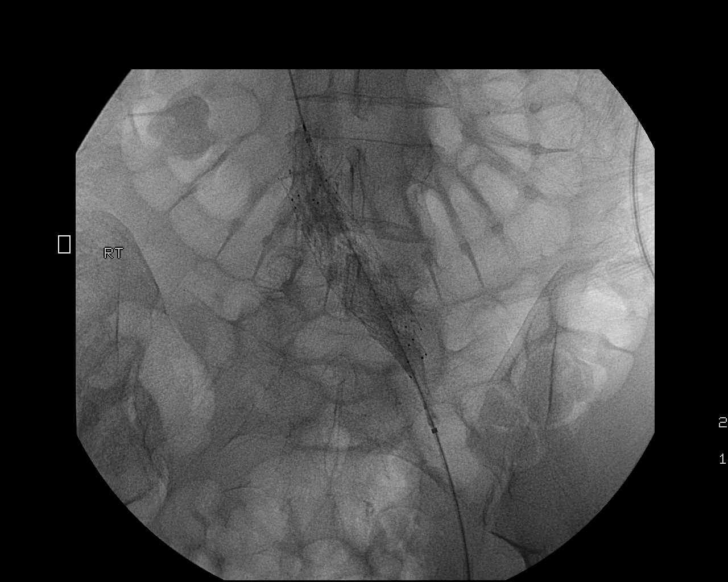
[im 4/19]
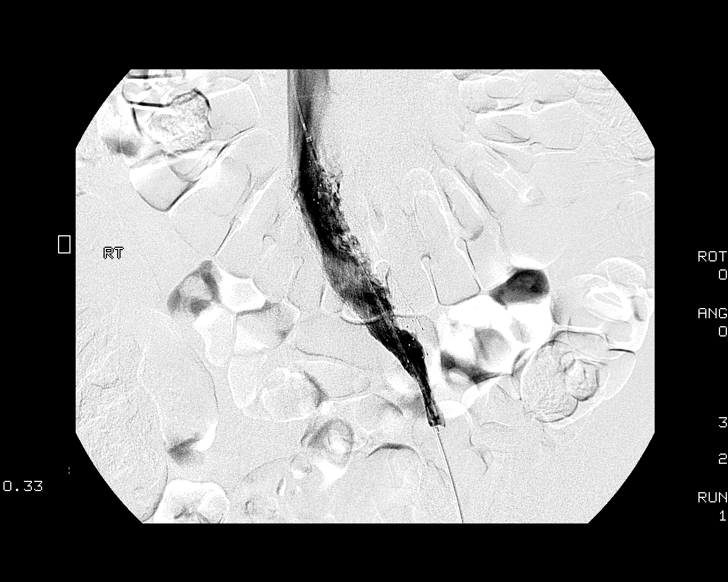
[im 6/19]
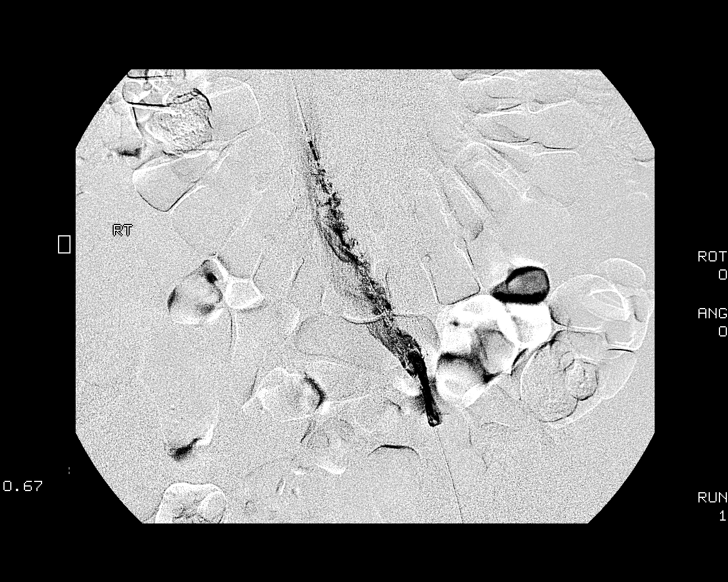
[im 8/19]
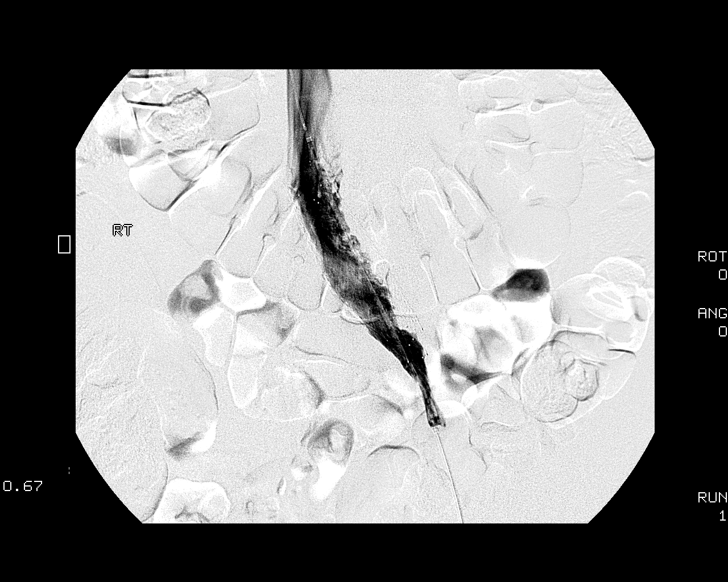
[im 9/19]
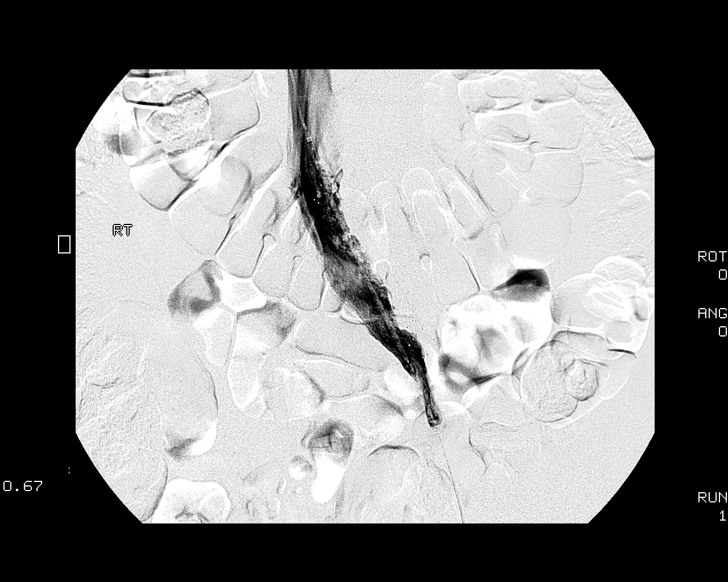
[im 11/19]
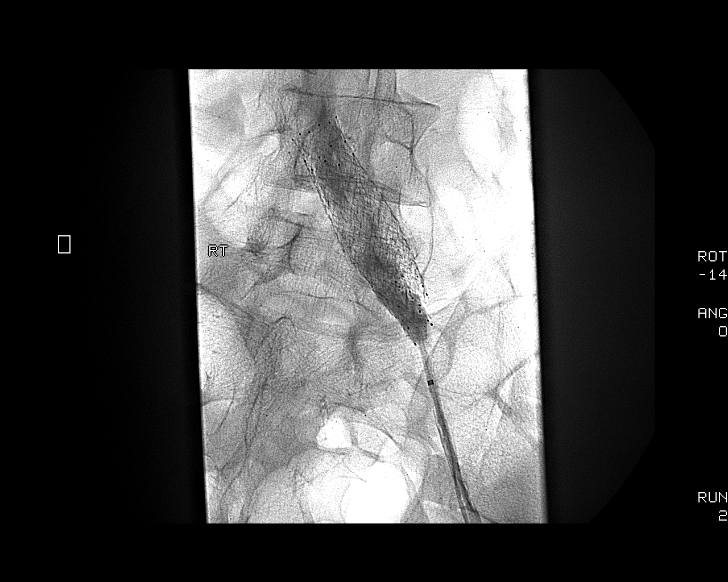
[im 12/19]
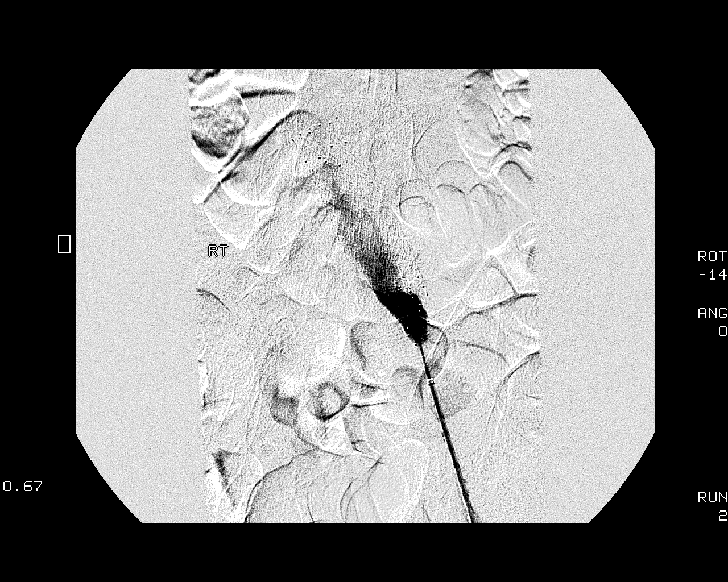
[im 14/19]
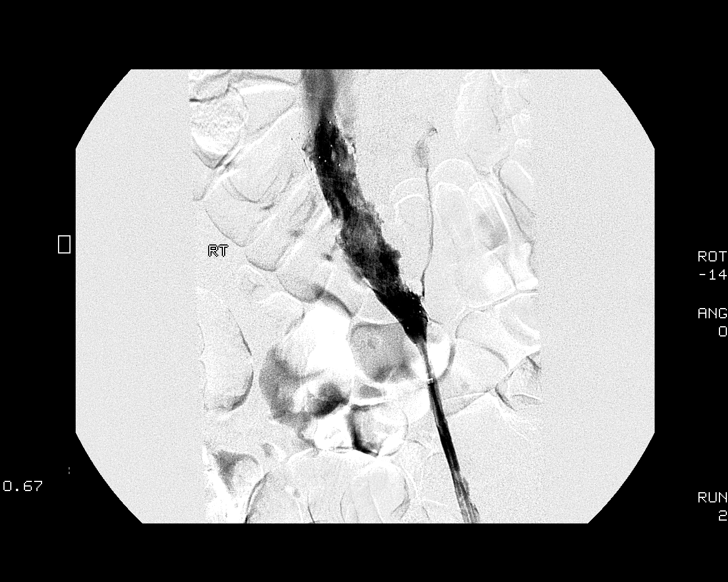
[im 16/19]
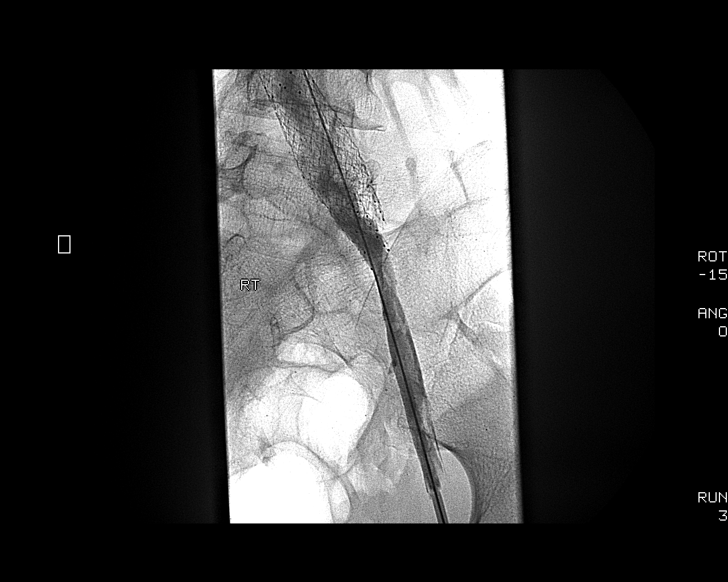
[im 17/19]
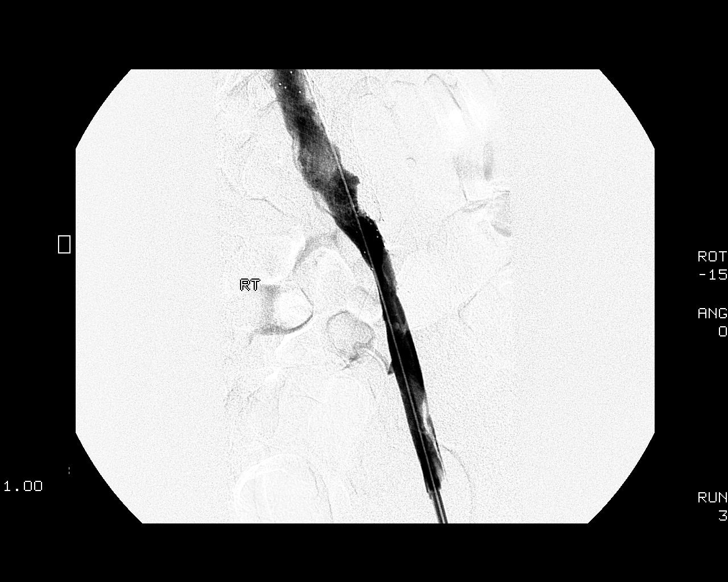
[im 19/19]
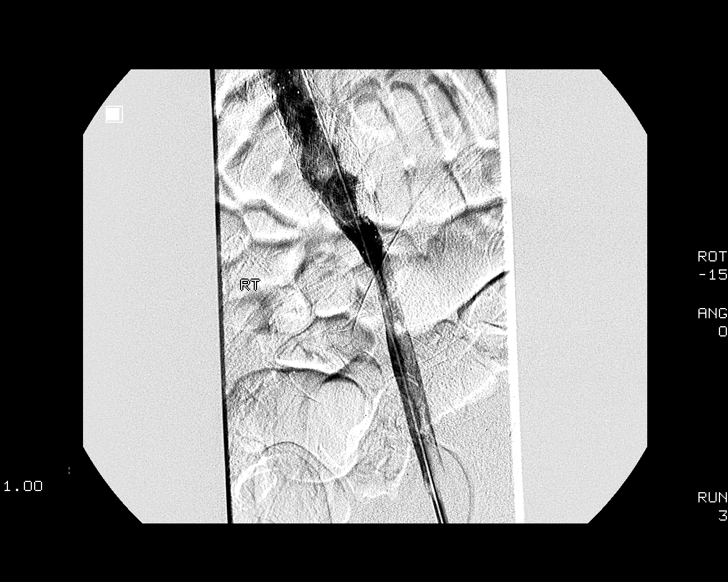

[12 of 19 positions shown; findings below may reference images not displayed]

Sedation: 2.0 mg IV Versed; 100 mcg IV Fentanyl.

Total Moderate Sedation Time: 8 minutes.

Contrast:  40 ml Visipaque 320

Fluoroscopy Time: 1.0 minutes.

Procedure:  The procedure, risks, benefits, and alternatives were
explained to the patient.  Questions regarding the procedure were
encouraged and answered.  The patient understands and consents to
the procedure.

The left groin sheath, infusion catheter and surrounding skin was
prepped with Betadine in a sterile fashion, and a sterile drape was
applied covering the operative field.  A sterile gown and sterile
gloves were used for the procedure. Local anesthesia was provided
with 1% Lidocaine.

Initial contrast injection was performed via a preexisting infusion
catheter.  Infusion catheter was retracted and further venogram
performed.  The infusion catheter was then removed and venography
performed through the preexisting femoral sheath.

Both the femoral sheath and jugular venous sheath were then removed
and hemostasis obtained with manual compression.

Complications: None
FINDINGS: Venography shows stable patency of the left common iliac
vein stents after recent revision and angioplasty yesterday.  A
mild amount of nonocclusive thrombus remained present within the
stent which is not flow-limiting.  The external iliac vein below
this level shows stable patency as well with minimal amount of
adherent mural thrombus which is not flow-limiting.  Thrombolytic
therapy was discontinued and access sheath removed.
IMPRESSION: Completion of venous thrombolytic therapy at the level of the right-
sided iliac veins as above with stable patency noted of the iliac
veins.  No further intervention was necessary.

## 2014-05-14 ENCOUNTER — Other Ambulatory Visit: Payer: Self-pay | Admitting: Hematology & Oncology

## 2014-06-15 ENCOUNTER — Telehealth: Payer: Self-pay | Admitting: Hematology & Oncology

## 2014-06-15 ENCOUNTER — Other Ambulatory Visit: Payer: Self-pay | Admitting: *Deleted

## 2014-06-15 DIAGNOSIS — D689 Coagulation defect, unspecified: Secondary | ICD-10-CM

## 2014-06-15 MED ORDER — RIVAROXABAN 20 MG PO TABS: 20.0000 mg | ORAL_TABLET | Freq: Every day | ORAL | Status: DC

## 2014-06-15 NOTE — Telephone Encounter (Signed)
Pt made 12-21 appointment

## 2014-07-28 ENCOUNTER — Other Ambulatory Visit: Payer: Self-pay | Admitting: *Deleted

## 2014-07-28 DIAGNOSIS — I82402 Acute embolism and thrombosis of unspecified deep veins of left lower extremity: Secondary | ICD-10-CM

## 2014-07-31 ENCOUNTER — Ambulatory Visit (HOSPITAL_BASED_OUTPATIENT_CLINIC_OR_DEPARTMENT_OTHER): Payer: BC Managed Care – PPO | Admitting: Hematology & Oncology

## 2014-07-31 ENCOUNTER — Encounter: Payer: Self-pay | Admitting: Hematology & Oncology

## 2014-07-31 ENCOUNTER — Ambulatory Visit (HOSPITAL_BASED_OUTPATIENT_CLINIC_OR_DEPARTMENT_OTHER): Payer: BC Managed Care – PPO | Admitting: Lab

## 2014-07-31 VITALS — BP 104/60 | HR 75 | Temp 98.3°F | Resp 16 | Ht 70.0 in | Wt 120.0 lb

## 2014-07-31 DIAGNOSIS — I871 Compression of vein: Secondary | ICD-10-CM

## 2014-07-31 DIAGNOSIS — D689 Coagulation defect, unspecified: Secondary | ICD-10-CM

## 2014-07-31 DIAGNOSIS — I82402 Acute embolism and thrombosis of unspecified deep veins of left lower extremity: Secondary | ICD-10-CM

## 2014-07-31 LAB — CBC WITH DIFFERENTIAL (CANCER CENTER ONLY)
BASO#: 0 10*3/uL (ref 0.0–0.2)
BASO%: 0.7 % (ref 0.0–2.0)
EOS%: 6.2 % (ref 0.0–7.0)
Eosinophils Absolute: 0.3 10*3/uL (ref 0.0–0.5)
HCT: 47 % (ref 38.7–49.9)
HGB: 16 g/dL (ref 13.0–17.1)
LYMPH#: 1.6 10*3/uL (ref 0.9–3.3)
LYMPH%: 35.9 % (ref 14.0–48.0)
MCH: 29.4 pg (ref 28.0–33.4)
MCHC: 34 g/dL (ref 32.0–35.9)
MCV: 86 fL (ref 82–98)
MONO#: 0.3 10*3/uL (ref 0.1–0.9)
MONO%: 7.5 % (ref 0.0–13.0)
NEUT%: 49.7 % (ref 40.0–80.0)
NEUTROS ABS: 2.2 10*3/uL (ref 1.5–6.5)
PLATELETS: 256 10*3/uL (ref 145–400)
RBC: 5.45 10*6/uL (ref 4.20–5.70)
RDW: 12.8 % (ref 11.1–15.7)
WBC: 4.5 10*3/uL (ref 4.0–10.0)

## 2014-07-31 LAB — CHCC SATELLITE - SMEAR

## 2014-07-31 LAB — PROTIME-INR (CHCC SATELLITE)
INR: 1.3 — ABNORMAL LOW (ref 2.0–3.5)
PROTIME: 15.6 s — AB (ref 10.6–13.4)

## 2014-07-31 MED ORDER — RIVAROXABAN 20 MG PO TABS: 20.0000 mg | ORAL_TABLET | Freq: Every day | ORAL | Status: DC

## 2014-07-31 NOTE — Progress Notes (Signed)
[facility]  Telephone:(336) [phone] Fax:(336) [phone]  ID: Willie PierNathan Charles OB: 07-31-1991 MR#: [id number] ZHY#:[id number]#:[id number] Patient Care Team: Provider Not In System as PCP - General  DIAGNOSIS: 1. 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 2. May-Thurner syndrome. 3. Mild protein C deficiency.  INTERVAL HISTORY: Willie Charles is here today for a follow-up. He is feeling great.   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  He was initially diagnosed with May-Thurner syndrome in December 2012 and treated with Coumadin and then Xarelto. In July 2013, he did have an aggressive intervention by Radiology when he had recurrence of a thrombus in the left common iliac vein. He had balloon angioplasty which was followed by stent. He had a thrombolytic therapy done into the thrombus. His last Doppler was back in December 2014. This did not show any acute thrombus in the left common femoral vein.He had good flow. He had some areas of thickening, which were recanalized. He is now on Xarelto 20mg  daily. He has had no episodes of bleeding.  No swelling, tenderness/pain, numbness or tingling in his extremities. No rash, SOB, chest pain, palpitations, abdominal pain, problems urinating, constipation, diarrhea, blood in urine or stool.   CURRENT TREATMENT: 1. Arixtra 7.5 mg subcu daily. 2. The patient will be transitioned back to Xarelto 20 mg p.o. Daily.  REVIEW OF SYSTEMS: All other 10 point review of systems is negative.   PAST MEDICAL HISTORY: Past Medical History  Diagnosis Date  . No pertinent past medical history   . DVT (deep venous thrombosis)     PAST SURGICAL HISTORY: Past Surgical History  Procedure Laterality Date  . Mouth surgery    . No past surgeries      FAMILY HISTORY No family history on file.  GYNECOLOGIC HISTORY:  No LMP for male patient.   SOCIAL HISTORY: History   Social History  .  Marital Status: Single    Spouse Name: N/A    Number of Children: N/A  . Years of Education: N/A   Occupational History  . Not on file.   Social History Main Topics  . Smoking status: Never Smoker   . Smokeless tobacco: Never Used     Comment: Never Used Tobacco  . Alcohol Use: 0.0 oz/week    0 Not specified per week     Comment: occasional  . Drug Use: No  . Sexual Activity: Not on file   Other Topics Concern  . Not on file   Social History Narrative    ADVANCED DIRECTIVES:  <no information>  HEALTH MAINTENANCE: History  Substance Use Topics  . Smoking status: Never Smoker   . Smokeless tobacco: Never Used     Comment: Never Used Tobacco  . Alcohol Use: 0.0 oz/week    0 Not specified per week     Comment: occasional   Colonoscopy: PAP: Bone density: Lipid panel:  Allergies  Allergen Reactions  . Warfarin Sodium Other (See Comments)    Causes blood clotting    Current Outpatient Prescriptions  Medication Sig Dispense Refill  . rivaroxaban (XARELTO) 20 MG TABS tablet Take 1 tablet (20 mg total) by mouth daily with supper. 30 tablet 11   No current facility-administered medications for this visit.   Facility-Administered Medications Ordered in Other Visits  Medication Dose Route Frequency Provider Last Rate Last Dose  . fondaparinux (ARIXTRA) injection 7.5 mg  7.5 mg Subcutaneous Once Malachy MoanHeath McCullough, MD      .  fondaparinux (ARIXTRA) injection 7.5 mg  7.5 mg Subcutaneous Once Malachy MoanHeath McCullough, MD      . HYDROcodone-acetaminophen (NORCO/VICODIN) 5-325 MG per tablet 1-2 tablet  1-2 tablet Oral Q4H PRN Malachy MoanHeath McCullough, MD        OBJECTIVE: Filed Vitals:   07/31/14 1500  BP: 104/60  Pulse: 75  Temp: 98.3 F (36.8 C)  Resp: 16   Filed Weights   07/31/14 1500  Weight: 120 lb (54.432 kg)   ECOG FS:0 - Asymptomatic Ocular: Sclerae unicteric, pupils equal, round and reactive to light Ear-nose-throat: Oropharynx clear, dentition fair Lymphatic: No  cervical or supraclavicular adenopathy Lungs no rales or rhonchi, good excursion bilaterally Heart regular rate and rhythm, no murmur appreciated Abd soft, nontender, positive bowel sounds MSK no focal spinal tenderness, no joint edema Neuro: non-focal, well-oriented, appropriate affect  LAB RESULTS: CMP     Component Value Date/Time   NA 141 07/27/2012 0734   K 4.2 07/27/2012 0734   CL 104 07/27/2012 0734   CO2 27 07/27/2012 0734   GLUCOSE 95 07/27/2012 0734   BUN 12 07/27/2012 0734   CREATININE 0.77 07/27/2012 0734   CALCIUM 9.5 07/27/2012 0734   PROT 7.8 08/01/2011 2022   ALBUMIN 4.3 08/01/2011 2022   AST 22 08/01/2011 2022   ALT 17 08/01/2011 2022   ALKPHOS 72 08/01/2011 2022   BILITOT 0.5 08/01/2011 2022   GFRNONAA >90 07/27/2012 0734   GFRAA >90 07/27/2012 0734   INo results found for: SPEP, UPEP Lab Results  Component Value Date   WBC 4.5 07/31/2014   NEUTROABS 2.2 07/31/2014   HGB 16.0 07/31/2014   HCT 47.0 07/31/2014   MCV 86 07/31/2014   PLT 256 07/31/2014   No results found for: LABCA2 No components found for: LABCA125  Recent Labs Lab 07/31/14 1414  INR 1.3*   Urinalysis No results found for: COLORURINE, APPEARANCEUR, LABSPEC, PHURINE, GLUCOSEU, HGBUR, BILIRUBINUR, KETONESUR, PROTEINUR, UROBILINOGEN, NITRITE, LEUKOCYTESUR STUDIES: No results found.  ASSESSMENT/PLAN: Willie Charles is a very nice 23 yo male with May-Thurner syndrome. He is currently on lifelong anticoagulation therapy with Xarelto 20 mg daily. He has had no bleeding and no evidence of recurrent thrombus.   We will see him back in 1 year for labs and follow-up.  He knows to call here with any questions or concerns and to go to the ED in the event of an emergency. We can certainly see him sooner if need be.   Verdie MosherINCINNATI,SARAH M, NP 07/31/2014 3:33 PM

## 2014-08-02 LAB — D-DIMER, QUANTITATIVE: D-Dimer, Quant: 0.27 ug/mL-FEU (ref 0.00–0.48)

## 2015-07-30 ENCOUNTER — Other Ambulatory Visit (HOSPITAL_BASED_OUTPATIENT_CLINIC_OR_DEPARTMENT_OTHER): Payer: BLUE CROSS/BLUE SHIELD

## 2015-07-30 ENCOUNTER — Ambulatory Visit (HOSPITAL_BASED_OUTPATIENT_CLINIC_OR_DEPARTMENT_OTHER): Payer: BLUE CROSS/BLUE SHIELD | Admitting: Hematology & Oncology

## 2015-07-30 ENCOUNTER — Encounter: Payer: Self-pay | Admitting: Hematology & Oncology

## 2015-07-30 VITALS — BP 98/62 | HR 73 | Temp 97.5°F | Resp 16 | Ht 70.0 in | Wt 117.0 lb

## 2015-07-30 DIAGNOSIS — I82432 Acute embolism and thrombosis of left popliteal vein: Secondary | ICD-10-CM | POA: Diagnosis not present

## 2015-07-30 DIAGNOSIS — I82402 Acute embolism and thrombosis of unspecified deep veins of left lower extremity: Secondary | ICD-10-CM | POA: Diagnosis not present

## 2015-07-30 DIAGNOSIS — D6859 Other primary thrombophilia: Secondary | ICD-10-CM | POA: Diagnosis not present

## 2015-07-30 LAB — CBC WITH DIFFERENTIAL (CANCER CENTER ONLY)
BASO#: 0 10*3/uL (ref 0.0–0.2)
BASO%: 1 % (ref 0.0–2.0)
EOS ABS: 0.3 10*3/uL (ref 0.0–0.5)
EOS%: 7.4 % — ABNORMAL HIGH (ref 0.0–7.0)
HCT: 46.7 % (ref 38.7–49.9)
HEMOGLOBIN: 15.8 g/dL (ref 13.0–17.1)
LYMPH#: 1.8 10*3/uL (ref 0.9–3.3)
LYMPH%: 44.4 % (ref 14.0–48.0)
MCH: 29 pg (ref 28.0–33.4)
MCHC: 33.8 g/dL (ref 32.0–35.9)
MCV: 86 fL (ref 82–98)
MONO#: 0.4 10*3/uL (ref 0.1–0.9)
MONO%: 9.1 % (ref 0.0–13.0)
NEUT#: 1.5 10*3/uL (ref 1.5–6.5)
NEUT%: 38.1 % — AB (ref 40.0–80.0)
PLATELETS: 240 10*3/uL (ref 145–400)
RBC: 5.44 10*6/uL (ref 4.20–5.70)
RDW: 12.6 % (ref 11.1–15.7)
WBC: 3.9 10*3/uL — ABNORMAL LOW (ref 4.0–10.0)

## 2015-07-30 LAB — PROTIME-INR (CHCC SATELLITE)
INR: 1.1 — ABNORMAL LOW (ref 2.0–3.5)
Protime: 13.2 Seconds (ref 10.6–13.4)

## 2015-07-30 LAB — CHCC SATELLITE - SMEAR

## 2015-07-30 LAB — D-DIMER, QUANTITATIVE: D-Dimer, Quant: 0.27 ug/mL-FEU (ref 0.00–0.48)

## 2015-07-30 NOTE — Progress Notes (Signed)
[facility]  Telephone:(336) [phone] Fax:(336) [phone]  ID: Ladell Pier OB: [birth year] MR#: [id number]#:[id number] Patient Care Team: Provider Not In System as PCP - General  DIAGNOSIS: 1. 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 2. May-Thurner syndrome. 3. Mild protein C deficiency.  INTERVAL HISTORY: Mr. Yim is here today for a follow-up. He is back from [place]. He lives and works up in [place]. 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  His left leg is not bothering him all that much. He still not running all that much. He wears a compression stocking on occasion.  He's had a problem with bleeding. He's had a change in bowel or bladder habits. He's had no cough. He's had no fever.  He still looks quite thin. He says he eats quite a bit. It sounds like he just has a very good metabolism.  CURRENT TREATMENT: 1. Xarelto  po q day  REVIEW OF SYSTEMS: All other 10 point review of systems is negative.   PAST MEDICAL HISTORY: Past Medical History  Diagnosis Date  . No pertinent past medical history   . DVT (deep venous thrombosis) (HCC)     PAST SURGICAL HISTORY: Past Surgical History  Procedure Laterality Date  . Mouth surgery    . No past surgeries      FAMILY HISTORY No family history on file.  GYNECOLOGIC HISTORY:  No LMP for male patient.   SOCIAL HISTORY: Social History   Social History  . Marital Status: Single    Spouse Name: N/A  . Number of Children: N/A  . Years of Education: N/A   Occupational History  . Not on file.   Social History Main Topics  . Smoking status: Never Smoker   . Smokeless tobacco: Never Used     Comment: Never Used Tobacco  . Alcohol Use: 0.0 oz/week    0 Standard drinks or equivalent per week     Comment: occasional  . Drug Use: No  . Sexual Activity: Not on file   Other Topics Concern  . Not on file   Social History Narrative    ADVANCED  DIRECTIVES:  <no information>  HEALTH MAINTENANCE: Social History  Substance Use Topics  . Smoking status: Never Smoker   . Smokeless tobacco: Never Used     Comment: Never Used Tobacco  . Alcohol Use: 0.0 oz/week    0 Standard drinks or equivalent per week     Comment: occasional   Colonoscopy: PAP: Bone density: Lipid panel:  Allergies  Allergen Reactions  . Warfarin Sodium Other (See Comments)    Causes blood clotting    Current Outpatient Prescriptions  Medication Sig Dispense Refill  . rivaroxaban (XARELTO) 20 MG TABS tablet Take 1 tablet (20 mg total) by mouth daily with supper. 30 tablet 11   No current facility-administered medications for this visit.   Facility-Administered Medications Ordered in Other Visits  Medication Dose Route Frequency Provider Last Rate Last Dose  . fondaparinux (ARIXTRA) injection 7.5 mg  7.5 mg Subcutaneous Once Malachy Moan, MD      . fondaparinux (ARIXTRA) injection 7.5 mg  7.5 mg Subcutaneous Once Malachy Moan, MD      . HYDROcodone-acetaminophen (NORCO/VICODIN) 5-325 MG per tablet 1-2 tablet  1-2 tablet Oral Q4H PRN Malachy Moan, MD        OBJECTIVE: Filed Vitals:   07/30/15 0908  BP: 98/62  Pulse: 73  Temp: 97.5 F (36.4 C)  Resp: 16    Filed Weights   07/30/15 0908  Weight: 117 lb (53.071 kg)   thin but well-nourished white gentleman in no obvious distress. Head and neck exam shows no ocular or oral lesions. There are no palpable cervical or supraclavicular lymph nodes. Lungs are clear to percussion and auscultation bilaterally. Cardiac exam regular in rhythm with no murmurs, rubs or bruits. Abdomen is soft. Has good bowel sounds. There is no fluid wave. There is no palpable liver or spleen tip. Back exam shows no tenderness over the spine, ribs or hips. Extremities shows no swelling in the left leg. He has a negative Homans sign. He has no palpable venous cord. He has good pulses in his distal extremities. Right  leg is unremarkable. Skin exam shows no rashes, ecchymoses or petechia. Neurological exam shows no focal neurological deficits.   LAB RESULTS: CMP     Component Value Date/Time   NA 141 07/27/2012 0734   K 4.2 07/27/2012 0734   CL 104 07/27/2012 0734   CO2 27 07/27/2012 0734   GLUCOSE 95 07/27/2012 0734   BUN 12 07/27/2012 0734   CREATININE 0.77 07/27/2012 0734   CALCIUM 9.5 07/27/2012 0734   PROT 7.8 08/01/2011 2022   ALBUMIN 4.3 08/01/2011 2022   AST 22 08/01/2011 2022   ALT 17 08/01/2011 2022   ALKPHOS 72 08/01/2011 2022   BILITOT 0.5 08/01/2011 2022   GFRNONAA >90 07/27/2012 0734   GFRAA >90 07/27/2012 0734   INo results found for: SPEP, UPEP Lab Results  Component Value Date   WBC 3.9* 07/30/2015   NEUTROABS 1.5 07/30/2015   HGB 15.8 07/30/2015   HCT 46.7 07/30/2015   MCV 86 07/30/2015   PLT 240 07/30/2015   No results found for: LABCA2 No components found for: LABCA125  Recent Labs Lab 07/30/15 0854  INR 1.1*   Urinalysis No results found for: COLORURINE, APPEARANCEUR, LABSPEC, PHURINE, GLUCOSEU, HGBUR, BILIRUBINUR, KETONESUR, PROTEINUR, UROBILINOGEN, NITRITE, LEUKOCYTESUR STUDIES: No results found.  ASSESSMENT/PLAN: Mr. Mort SawyersHubble is a very nice 24 yo male with May-Thurner syndrome. He is currently on lifelong anticoagulation therapy with Xarelto 20 mg daily. He has had no bleeding and no evidence of recurrent thrombus.    His last Doppler was done back in 2014. Shows that he has a chronic thrombus in the left leg has recanalized. Since he  really has no symptoms, I don't think that we have to do any review Dopplers.  At this point, I don't think we had to see him back. If he has any issues, we will be 1 having to see him back.   Josph MachoENNEVER,Hilmar Moldovan R, MD 07/30/2015 10:13 AM

## 2015-08-18 ENCOUNTER — Other Ambulatory Visit: Payer: Self-pay | Admitting: Family

## 2015-09-19 ENCOUNTER — Other Ambulatory Visit: Payer: Self-pay | Admitting: Family

## 2015-10-23 ENCOUNTER — Other Ambulatory Visit: Payer: Self-pay | Admitting: *Deleted

## 2015-10-23 DIAGNOSIS — I82432 Acute embolism and thrombosis of left popliteal vein: Secondary | ICD-10-CM

## 2015-10-23 MED ORDER — RIVAROXABAN 20 MG PO TABS: 20.0000 mg | ORAL_TABLET | Freq: Every day | ORAL | Status: DC

## 2015-12-25 ENCOUNTER — Encounter: Payer: Self-pay | Admitting: *Deleted

## 2016-07-08 ENCOUNTER — Other Ambulatory Visit: Payer: Self-pay | Admitting: *Deleted

## 2016-07-08 DIAGNOSIS — I82432 Acute embolism and thrombosis of left popliteal vein: Secondary | ICD-10-CM

## 2016-07-08 MED ORDER — RIVAROXABAN 20 MG PO TABS: 20.0000 mg | ORAL_TABLET | Freq: Every day | ORAL | 11 refills | Status: DC

## 2017-08-06 ENCOUNTER — Other Ambulatory Visit: Payer: Self-pay | Admitting: *Deleted

## 2017-08-06 DIAGNOSIS — I82432 Acute embolism and thrombosis of left popliteal vein: Secondary | ICD-10-CM

## 2017-08-06 MED ORDER — RIVAROXABAN 20 MG PO TABS: 20.0000 mg | ORAL_TABLET | Freq: Every day | ORAL | 11 refills | Status: DC

## 2018-08-07 ENCOUNTER — Other Ambulatory Visit: Payer: Self-pay | Admitting: Hematology & Oncology

## 2018-08-07 DIAGNOSIS — I82432 Acute embolism and thrombosis of left popliteal vein: Secondary | ICD-10-CM

## 2020-08-06 ENCOUNTER — Telehealth: Payer: Self-pay | Admitting: *Deleted

## 2020-08-06 ENCOUNTER — Other Ambulatory Visit: Payer: Self-pay | Admitting: *Deleted

## 2020-08-06 DIAGNOSIS — I82432 Acute embolism and thrombosis of left popliteal vein: Secondary | ICD-10-CM

## 2020-08-06 MED ORDER — RIVAROXABAN 20 MG PO TABS: ORAL_TABLET | ORAL | 3 refills | Status: AC

## 2020-08-06 NOTE — Telephone Encounter (Signed)
Message received from patient requesting a refill of his Xarelto.  Call placed back to patient and patient notified per order of Dr. Myna Hidalgo that a three month supply only will be sent in and that pt should follow up with his hematologist in [place] for further refills.  Pt states that he is currently not seeing a hematologist in [place] and would like to know if he can see Dr. Myna Hidalgo when he is in town.  Notified pt that Dr. Myna Hidalgo would see him when he is in town if needed, but that he should get established with a PCP and hematologist in his home town.  Pt states that he will call office back when he will be in .

## 2021-05-21 ENCOUNTER — Telehealth: Payer: Self-pay

## 2021-05-21 NOTE — Telephone Encounter (Signed)
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 Called patient back and informed him since he has transferred care and is under care of another hematologist Dr.Ennever will not be able to prescribe that. Patient verbalized understanding and states he would like to transfer care back to Dr.Ennever as he visits frequently and can see him when he visits. Informed patient that was finwe and have his hematologist refer him back to Dr.Ennever. he verablized udnerstanding and denies any other questions or concerns.
